# Patient Record
Sex: Female | Born: 1990
Health system: Southern US, Community
[De-identification: ages and names within clinical notes are randomized; demographics above are authoritative.]

## PROBLEM LIST (undated history)

## (undated) DIAGNOSIS — E119 Type 2 diabetes mellitus without complications: Secondary | ICD-10-CM

## (undated) DIAGNOSIS — F909 Attention-deficit hyperactivity disorder, unspecified type: Secondary | ICD-10-CM

## (undated) HISTORY — DX: Type 2 diabetes mellitus without complications: E11.9

## (undated) HISTORY — DX: Attention-deficit hyperactivity disorder, unspecified type: F90.9

## (undated) HISTORY — PX: TONSILLECTOMY: SUR1361

## (undated) HISTORY — PX: TYMPANOSTOMY TUBE PLACEMENT: SHX32

---
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
## 2008-04-07 ENCOUNTER — Emergency Department (HOSPITAL_COMMUNITY): Admission: EM | Admit: 2008-04-07 | Discharge: 2008-04-07 | Payer: Self-pay | Admitting: Emergency Medicine

## 2008-04-08 ENCOUNTER — Emergency Department (HOSPITAL_COMMUNITY): Admission: EM | Admit: 2008-04-08 | Discharge: 2008-04-08 | Payer: Self-pay | Admitting: Emergency Medicine

## 2008-07-09 ENCOUNTER — Encounter (HOSPITAL_COMMUNITY): Admission: RE | Admit: 2008-07-09 | Discharge: 2008-08-08 | Payer: Self-pay | Admitting: Orthopedic Surgery

## 2008-12-04 IMAGING — CT CT HEAD W/O CM
1 series · 16 of 30 positions shown, 20 images · non-contrast
Comparison: None

CLINICAL DATA: Frontal headache with light sensitivity.

CT HEAD WITHOUT CONTRAST
TECHNIQUE: Contiguous axial images were obtained from the base of
the skull through the vertex without contrast.

[Series 2: headseq 4.8 h37s · axial · 0.43mm/px · z∈[+69,+221]mm · 16 of 36 slices shown, 20 images]
[im 2/36  brain]
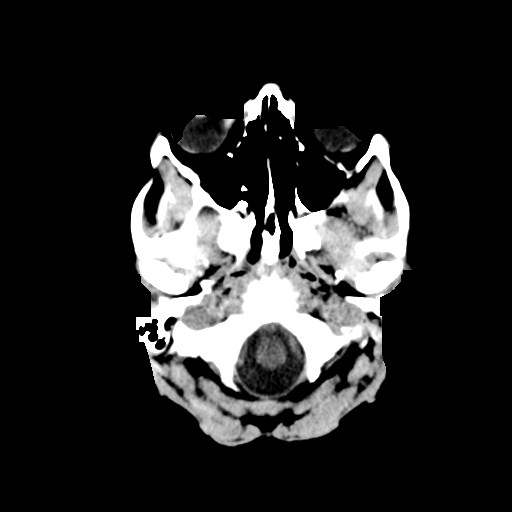
[im 2/36  bone]
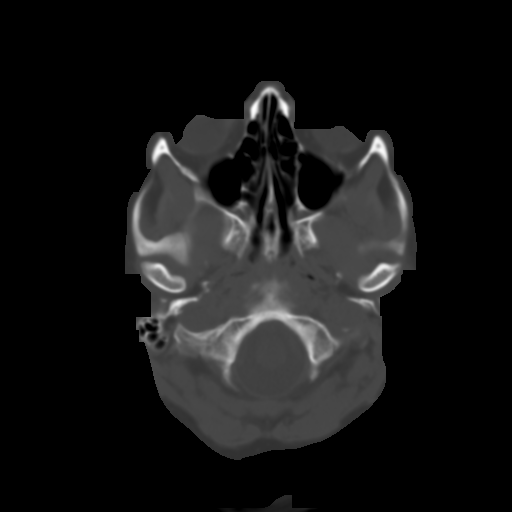
[im 4/36  brain]
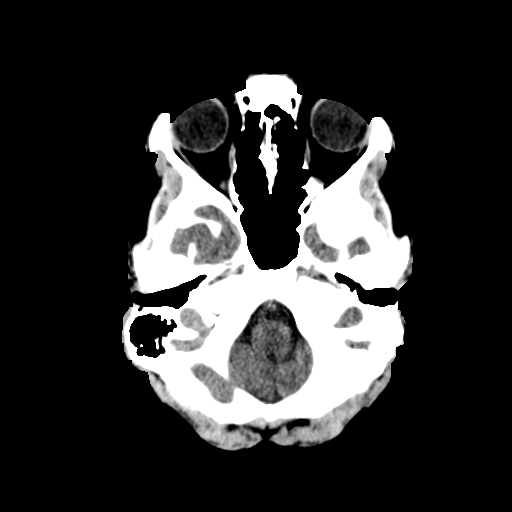
[im 7/36  brain]
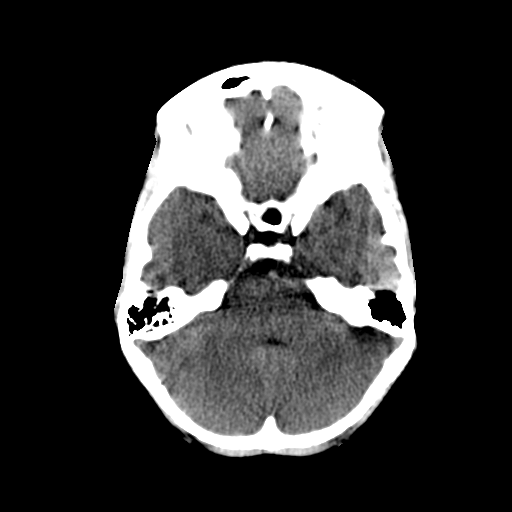
[im 9/36  brain]
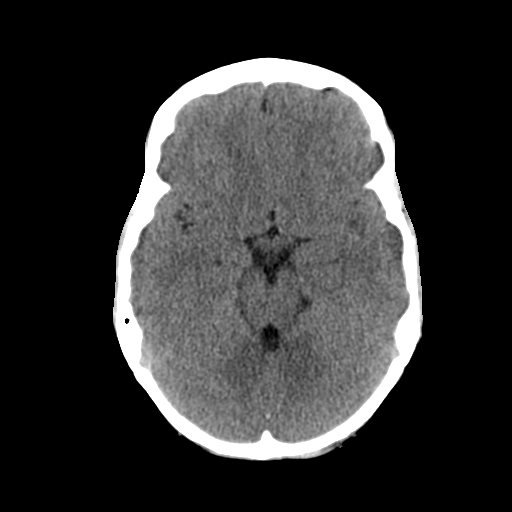
[im 10/36  brain]
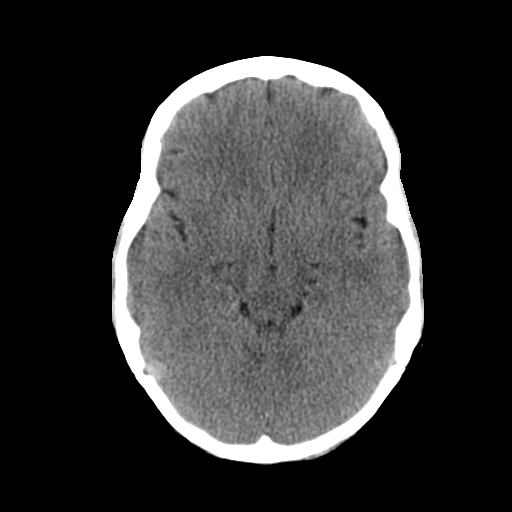
[im 10/36  bone]
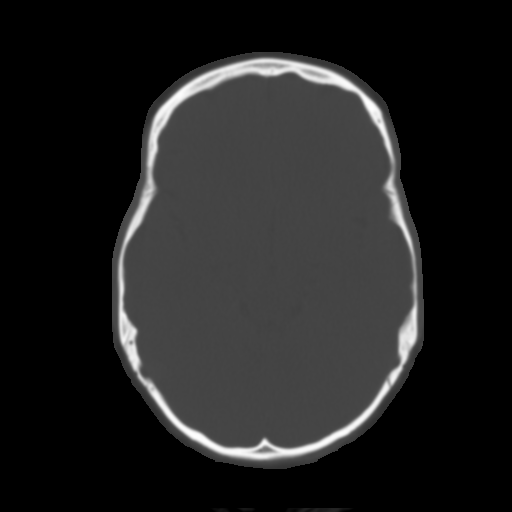
[im 13/36  brain]
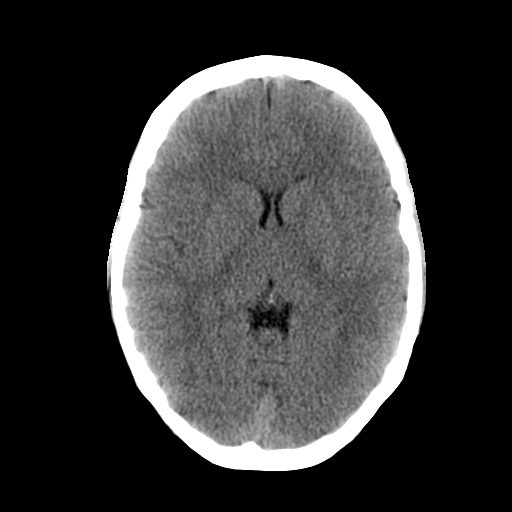
[im 15/36  brain]
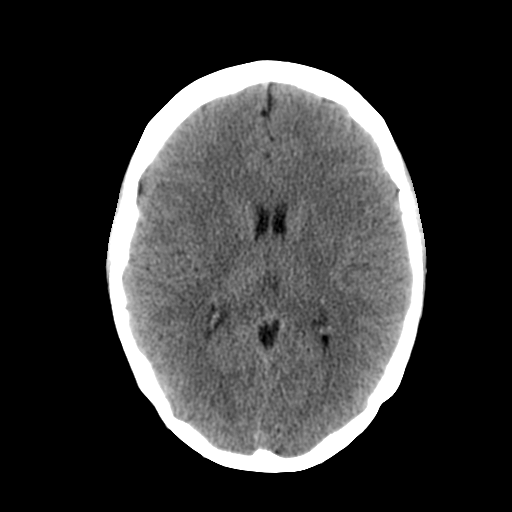
[im 17/36  brain]
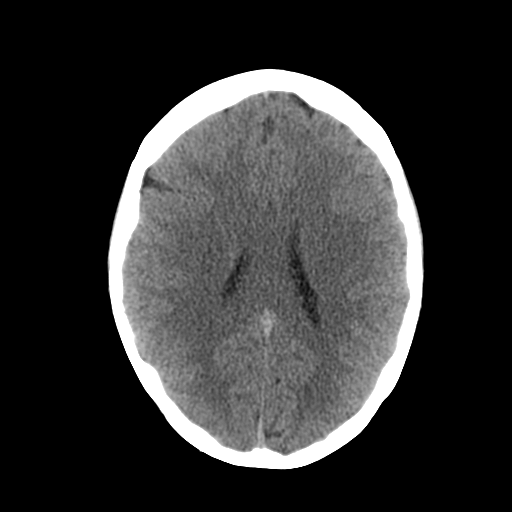
[im 19/36  brain]
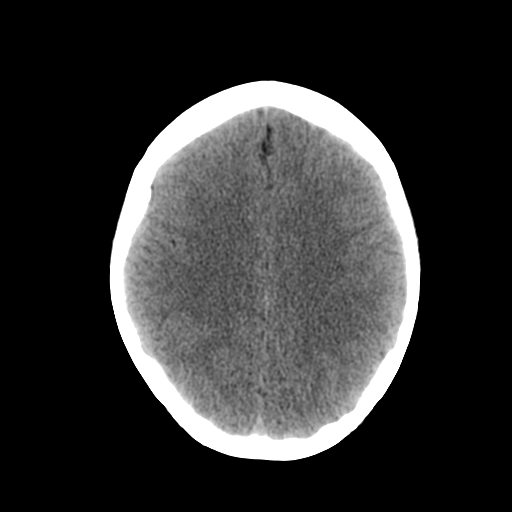
[im 19/36  bone]
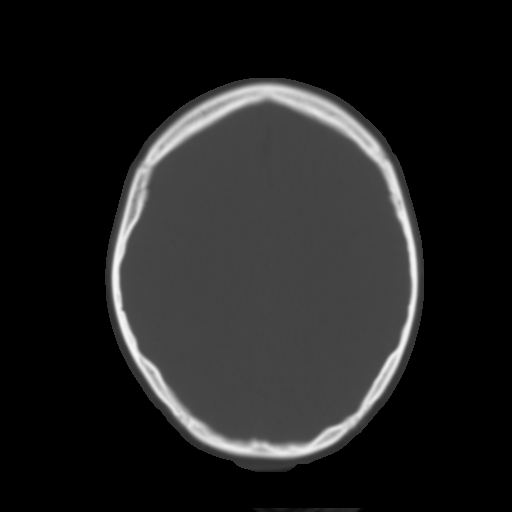
[im 21/36  brain]
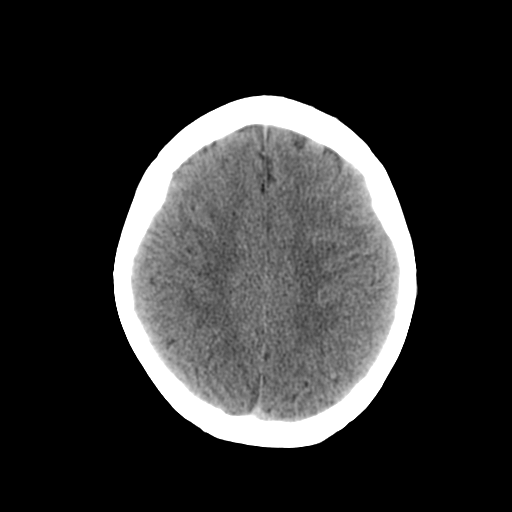
[im 23/36  brain]
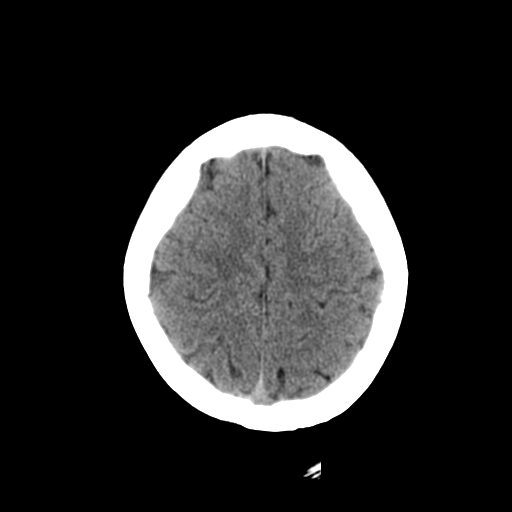
[im 26/36  brain]
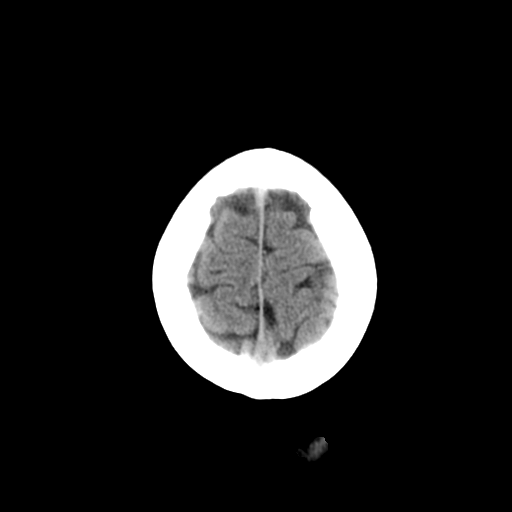
[im 27/36  brain]
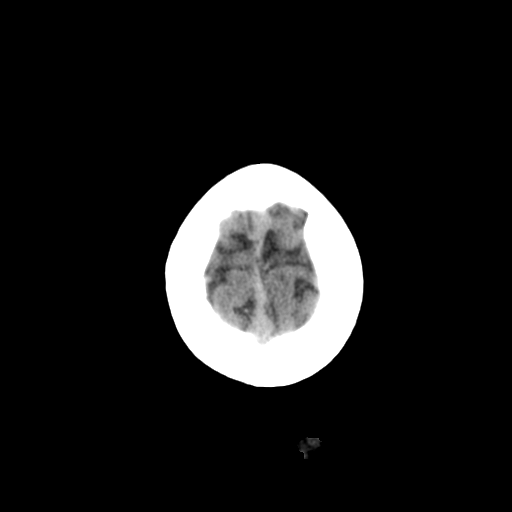
[im 27/36  bone]
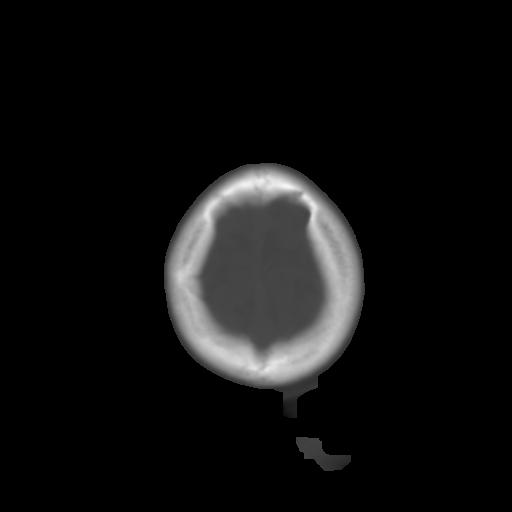
[im 29/36  brain]
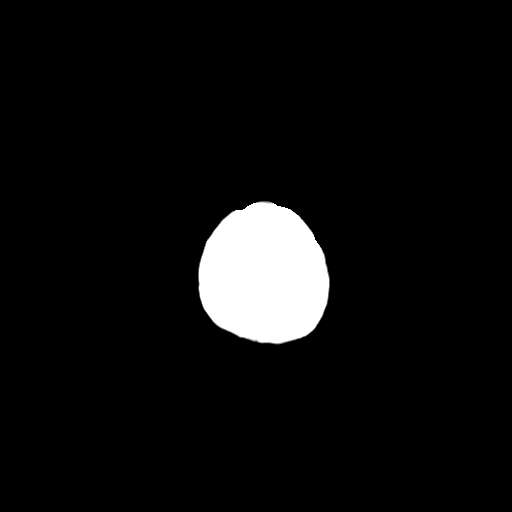
[im 32/36  brain]
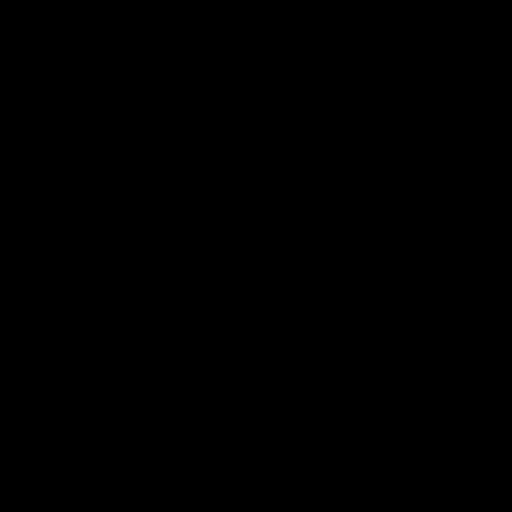
[im 34/36  brain]
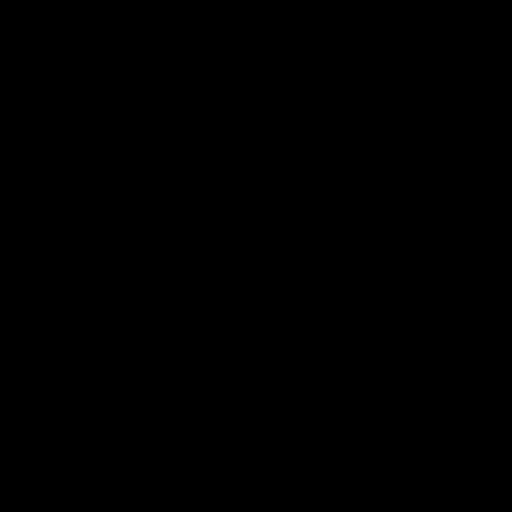

[16 of 30 positions shown; findings below may reference images not displayed]

FINDINGS: There is no evidence for acute hemorrhage, hydrocephalus,
mass lesion, or abnormal extra-axial fluid collection.  No definite
CT evidence for acute infarct.

Visualized portions of the paranasal sinuses are clear.  No
evidence for mastoid air cell effusion.  The the patient may have
had previous surgery in the left mastoid air cells.
IMPRESSION: No acute intracranial abnormality.

Apparent postsurgical change in the left mastoid air cells.

## 2010-07-14 LAB — URINE MICROSCOPIC-ADD ON

## 2010-07-14 LAB — CBC
Hemoglobin: 13.5 g/dL (ref 12.0–15.0)
Hemoglobin: 14.1 g/dL (ref 12.0–15.0)
MCHC: 32.8 g/dL (ref 30.0–36.0)
MCHC: 32.9 g/dL (ref 30.0–36.0)
Platelets: 259 10*3/uL (ref 150–400)
RBC: 5.15 MIL/uL — ABNORMAL HIGH (ref 3.87–5.11)
RDW: 13.4 % (ref 11.5–15.5)
WBC: 11.4 10*3/uL — ABNORMAL HIGH (ref 4.0–10.5)
WBC: 9.1 10*3/uL (ref 4.0–10.5)

## 2010-07-14 LAB — DIFFERENTIAL
Basophils Relative: 0 % (ref 0–1)
Basophils Relative: 0 % (ref 0–1)
Eosinophils Absolute: 0 10*3/uL (ref 0.0–0.7)
Eosinophils Relative: 0 % (ref 0–5)
Monocytes Absolute: 0.2 10*3/uL (ref 0.1–1.0)
Monocytes Relative: 6 % (ref 3–12)
Neutrophils Relative %: 88 % — ABNORMAL HIGH (ref 43–77)

## 2010-07-14 LAB — COMPREHENSIVE METABOLIC PANEL
AST: 21 U/L (ref 0–37)
BUN: 13 mg/dL (ref 6–23)
CO2: 26 mEq/L (ref 19–32)
Calcium: 9.2 mg/dL (ref 8.4–10.5)
Creatinine, Ser: 0.6 mg/dL (ref 0.4–1.2)
GFR calc non Af Amer: >60 mL/min (ref >60)
Glucose, Bld: 114 mg/dL — ABNORMAL HIGH (ref 70–99)
Potassium: 3.4 mEq/L — ABNORMAL LOW (ref 3.5–5.1)
Sodium: 138 mEq/L (ref 135–145)
Total Bilirubin: 0.6 mg/dL (ref 0.3–1.2)

## 2010-07-14 LAB — URINALYSIS, ROUTINE W REFLEX MICROSCOPIC
Protein, ur: NEGATIVE mg/dL
Urobilinogen, UA: 0.2 mg/dL (ref 0.0–1.0)

## 2010-07-14 LAB — LIPASE, BLOOD: Lipase: 35 U/L (ref 11–59)

## 2011-01-07 LAB — URINALYSIS, ROUTINE W REFLEX MICROSCOPIC
Hgb urine dipstick: NEGATIVE
Ketones, ur: NEGATIVE
Protein, ur: NEGATIVE
Specific Gravity, Urine: 1.02

## 2019-03-21 ENCOUNTER — Other Ambulatory Visit: Payer: Self-pay

## 2019-03-21 ENCOUNTER — Ambulatory Visit: Payer: HRSA Program | Attending: Internal Medicine

## 2019-03-21 DIAGNOSIS — Z20822 Contact with and (suspected) exposure to covid-19: Secondary | ICD-10-CM

## 2019-03-21 DIAGNOSIS — Z20828 Contact with and (suspected) exposure to other viral communicable diseases: Secondary | ICD-10-CM | POA: Diagnosis not present

## 2019-03-23 LAB — NOVEL CORONAVIRUS, NAA: SARS-CoV-2, NAA: NOT DETECTED

## 2019-05-22 ENCOUNTER — Other Ambulatory Visit: Payer: Self-pay

## 2019-05-22 ENCOUNTER — Ambulatory Visit: Payer: BC Managed Care – PPO | Attending: Internal Medicine

## 2019-05-22 DIAGNOSIS — Z20822 Contact with and (suspected) exposure to covid-19: Secondary | ICD-10-CM | POA: Insufficient documentation

## 2019-05-23 LAB — NOVEL CORONAVIRUS, NAA: SARS-CoV-2, NAA: NOT DETECTED

## 2020-02-01 ENCOUNTER — Other Ambulatory Visit: Payer: Self-pay | Admitting: Family Medicine

## 2020-02-01 DIAGNOSIS — G44209 Tension-type headache, unspecified, not intractable: Secondary | ICD-10-CM

## 2020-07-24 ENCOUNTER — Encounter (HOSPITAL_COMMUNITY): Payer: Self-pay

## 2020-07-24 ENCOUNTER — Other Ambulatory Visit: Payer: Self-pay

## 2020-07-24 ENCOUNTER — Ambulatory Visit (HOSPITAL_COMMUNITY): Payer: 59 | Attending: Family Medicine

## 2020-07-24 DIAGNOSIS — M6281 Muscle weakness (generalized): Secondary | ICD-10-CM

## 2020-07-24 DIAGNOSIS — M545 Low back pain, unspecified: Secondary | ICD-10-CM | POA: Diagnosis not present

## 2020-07-24 DIAGNOSIS — R262 Difficulty in walking, not elsewhere classified: Secondary | ICD-10-CM | POA: Diagnosis present

## 2020-07-24 NOTE — Therapy (Signed)
[facility] [address] [place], [place], [address] Phone: [phone]   Fax:  [phone]  Physical Therapy Evaluation  Patient Details  Name: Anita Jacobson MRN: [id number] Date of Birth: 12-19-1990 Referring Provider (PT): Donzetta Sprung, MD   Encounter Date: 07/24/2020   PT End of Session - 07/24/20 0947    Visit Number 1    Number of Visits 8    Date for PT Re-Evaluation 08/21/20    Authorization Type [employer]; x 10 auth req, 30 VL (0 used)    Authorization - Visit Number 1    Authorization - Number of Visits 30    Progress Note Due on Visit 8    PT Start Time [phone]    PT Stop Time 1030    PT Time Calculation (min) 44 min    Activity Tolerance Patient tolerated treatment well    Behavior During Therapy [facility] for tasks assessed/performed           History reviewed. No pertinent past medical history.  History reviewed. No pertinent surgical history.  There were no vitals filed for this visit.    Subjective Assessment - 07/24/20 0950    Subjective End of January was picking up object from the ground and felt onset of back pain and now has sharp pain radiating from low back along buttocks and lateral right thigh    Limitations Sitting;Lifting;Standing    How long can you sit comfortably? 5-10 min    How long can you walk comfortably? 10 min    Diagnostic tests x-ray    Patient Stated Goals "Get my back and leg feeling better"    Currently in Pain? Yes    Pain Score 7     Pain Location Back    Pain Orientation Right    Pain Descriptors / Indicators Aching;Guarding    Pain Type Chronic pain    Pain Radiating Towards right buttocks, lateral thigh, knee    Pain Onset More than a month ago    Pain Frequency Intermittent    Aggravating Factors  prolonged sitting, standing, bending, lifting, twisting    Effect of Pain on Daily Activities pt has had to drop to part-time work hours due to current condition               [facility] PT Assessment - 07/24/20 0001      Assessment   Medical Diagnosis LBP    Referring Provider (PT) Donzetta Sprung, MD    Onset Date/Surgical Date --   End of January   Next MD Visit some time in May      Balance Screen   Has the patient fallen in the past 6 months No    Has the patient had a decrease in activity level because of a fear of falling?  No    Is the patient reluctant to leave their home because of a fear of falling?  No      Home Environment   Living Environment Private residence    Living Arrangements Spouse/significant other;Children    Available Help at Discharge Family    Type of Home House    Home Access Stairs to enter    Home Layout One level      Prior Function   Level of Independence Independent    Vocation Full time employment    Vocation Requirements works at [employer] and [employer]      Observation/Other Assessments   Observations poor lifting mechanics  observed    Focus on Therapeutic Outcomes (FOTO)  45% function      ROM / Strength   AROM / PROM / Strength AROM;Strength      AROM   AROM Assessment Site Lumbar    Lumbar Flexion 50% limited    Lumbar Extension 25% limited, painful    Lumbar - Right Side Bend WNL    Lumbar - Left Side Bend WNL    Lumbar - Right Rotation WNL    Lumbar - Left Rotation WNL      Strength   Strength Assessment Site Lumbar    Lumbar Flexion 2+/5    Lumbar Extension 2+/5      Palpation   Spinal mobility guarding noted against PA pressure      Special Tests    Special Tests Lumbar    Lumbar Tests Slump Test;Prone Knee Bend Test;Straight Leg Raise      Slump test   Findings Positive    Side Right      Prone Knee Bend Test   Findings Negative    Side Right      Straight Leg Raise   Findings Positive    Side  Right   15 degrees   Comment Lt   20 degrees     Ambulation/Gait   Ambulation/Gait Yes    Ambulation/Gait Assistance 7: Independent    Ambulation Distance (Feet) 250 Feet     Assistive device None    Gait Pattern Antalgic   RLE   Gait velocity decreased    Gait Comments                      Objective measurements completed on examination: See above findings.       [facility] Adult PT Treatment/Exercise - 07/24/20 0001      Exercises   Exercises Lumbar      Lumbar Exercises: Stretches   Standing Extension 10 reps;5 seconds      Lumbar Exercises: Seated   Other Seated Lumbar Exercises seated lumbar extensions with McKenzie roll      Lumbar Exercises: Prone   Other Prone Lumbar Exercises prone lying x 5 min, prone on elbows 2x10, prone press-ups 1x10                  PT Education - 07/24/20 1035    Education Details education on assessment findings, postural re-education, lumbar support    Person(s) Educated Patient    Methods Explanation;Demonstration;Handout    Comprehension Verbalized understanding;Need further instruction            PT Short Term Goals - 07/24/20 1039      PT SHORT TERM GOAL #1   Title Patient will report at least 25% improvement in symptoms for improved quality of life.    Time 2    Period Weeks    Status New    Target Date 08/07/20      PT SHORT TERM GOAL #2   Title Patient will be independent with HEP in order to improve functional outcomes.    Time 2    Period Weeks    Status New    Target Date 08/07/20      PT SHORT TERM GOAL #3   Title Pt will report RLE symptoms not extending beyond right buttocks to manifest centralization of symptoms    Baseline lateral right knee    Time 2    Period Weeks    Status New  Target Date 08/07/20             PT Long Term Goals - 07/24/20 1041      PT LONG TERM GOAL #1   Title Patient will improve on FOTO score to meet predicted outcomes to improve functional independence    Baseline 45% function    Time 4    Period Weeks    Status New    Target Date 08/21/20      PT LONG TERM GOAL #2   Title Patient will demo core strength of 3+/5 to  improve trunk stability and lifting mechanics to reduce risk for re-injury    Baseline 2+/5 flexion/extension    Time 4    Period Weeks    Status New    Target Date 08/21/20      PT LONG TERM GOAL #3   Title Pt will report 0/10 pain and improved ambulation tolerance as evidenced by 400 ft during    Baseline 250 ft with 7/10 back and RLE pain    Time 4    Period Weeks    Status New    Target Date 08/21/20                  Plan - 07/24/20 1036    Clinical Impression Statement Patient is a  30 yo lady presenting to physical therapy with c/o right side LBP and radiating RLE pain. She presents with pain limited deficits in trunk strength, ROM, endurance, postural impairments, spinal mobility and functional mobility with ADL. She is having to modify and restrict ADL as indicated by FOTO score as well as subjective information and objective measures which is affecting overall participation. Patient will benefit from skilled physical therapy in order to improve function and reduce impairment.    Personal Factors and Comorbidities Time since onset of injury/illness/exacerbation    Examination-Activity Limitations Bend;Caring for Others;Carry;Lift;Stand;Squat;Sit;Locomotion Level    Examination-Participation Restrictions Cleaning;Community Activity;Driving;Laundry;Yard Work;Occupation    Stability/Clinical Decision Making Stable/Uncomplicated    Clinical Decision Making Low    Rehab Potential Excellent    PT Frequency 2x / week    PT Duration 4 weeks    PT Treatment/Interventions ADLs/Self Care Home Management;Aquatic Therapy;Electrical Stimulation;Ultrasound;Traction;Neuromuscular re-education;Gait training;Stair training;Functional mobility training;Therapeutic activities;Therapeutic exercise;Balance training;Patient/family education;Manual techniques;Passive range of motion;Taping;Energy conservation;Dry needling;Spinal Manipulations;Joint Manipulations    PT Next Visit Plan assess  for extension-bias response, progress extension based program if applicable, neutral core strengthening    PT Home Exercise Plan prone position to press-ups, sitting with lumbar roll, standing extensions    Consulted and Agree with Plan of Care Patient           Patient will benefit from skilled therapeutic intervention in order to improve the following deficits and impairments:  Abnormal gait,Decreased activity tolerance,Decreased mobility,Decreased endurance,Decreased range of motion,Decreased strength,Difficulty walking,Increased muscle spasms,Impaired perceived functional ability,Postural dysfunction,Pain,Obesity,Improper body mechanics  Visit Diagnosis: Right low back pain, unspecified chronicity, unspecified whether sciatica present  Difficulty in walking, not elsewhere classified  Muscle weakness (generalized)     Problem List There are no problems to display for this patient.  10:45 AM, 07/24/20 M. Shary Decamp, PT, DPT Physical Therapist- [place] Office Number: [phone]  [facility] [address] [place], [place], [address] Phone: [phone]   Fax:  [phone]  Name: Anita Jacobson MRN: [id number] Date of Birth: 12-Mar-1991

## 2020-07-24 NOTE — Patient Instructions (Signed)
Access Code: [email or web address] URL: [email or web address] Date: 07/24/2020 Prepared by: Shary Decamp  Exercises Prone Press Up On Elbows - 1 x daily - 7 x weekly - 3 sets - 10 reps Prone Press Up - 1 x daily - 7 x weekly - 3 sets - 10 reps Seated Thoracic Lumbar Extension with Pectoralis Stretch - 1 x daily - 7 x weekly - 3 sets - 10 reps Standing Lumbar Extension - 1 x daily - 7 x weekly - 3 sets - 10 reps

## 2020-07-31 ENCOUNTER — Ambulatory Visit (HOSPITAL_COMMUNITY): Payer: 59

## 2020-07-31 ENCOUNTER — Telehealth (HOSPITAL_COMMUNITY): Payer: Self-pay

## 2020-07-31 NOTE — Telephone Encounter (Signed)
Patient's partner has covid and pt cx for today- Anita Jacobson will return on 08/07/20

## 2020-08-02 ENCOUNTER — Ambulatory Visit (HOSPITAL_COMMUNITY): Payer: 59

## 2020-08-07 ENCOUNTER — Encounter (HOSPITAL_COMMUNITY): Payer: Self-pay

## 2020-08-07 ENCOUNTER — Ambulatory Visit (HOSPITAL_COMMUNITY): Payer: 59 | Attending: Family Medicine

## 2020-08-07 ENCOUNTER — Other Ambulatory Visit: Payer: Self-pay

## 2020-08-07 DIAGNOSIS — M6281 Muscle weakness (generalized): Secondary | ICD-10-CM | POA: Diagnosis present

## 2020-08-07 DIAGNOSIS — R262 Difficulty in walking, not elsewhere classified: Secondary | ICD-10-CM | POA: Insufficient documentation

## 2020-08-07 DIAGNOSIS — M545 Low back pain, unspecified: Secondary | ICD-10-CM | POA: Diagnosis not present

## 2020-08-07 NOTE — Patient Instructions (Signed)
Access Code: [email or web address] URL: [email or web address] Date: 08/07/2020 Prepared by: Shary Decamp  Exercises Prone Alternating Arm and Leg Lifts - 1 x daily - 7 x weekly - 3 sets - 10 reps Prone Quadriceps Stretch with Strap - 1 x daily - 7 x weekly - 3 sets - 3 reps - 30-60 sec hold Abdominal Bracing - 1 x daily - 7 x weekly - 3 sets - 10 reps Push-Up on Counter - 1 x daily - 7 x weekly - 3 sets - 10 reps

## 2020-08-07 NOTE — Therapy (Signed)
[facility] [address] [place], [place], [address] Phone: [phone]   Fax:  [phone]  Physical Therapy Treatment  Patient Details  Name: Anita Jacobson MRN: [id number] Date of Birth: 10/14/1990 Referring Provider (PT): Donzetta Sprung, MD   Encounter Date: 08/07/2020   PT End of Session - 08/07/20 0954    Visit Number 2    Number of Visits 8    Date for PT Re-Evaluation 08/21/20    Authorization Type [employer]; x 10 auth req, 30 VL (0 used)    Authorization - Visit Number 2    Authorization - Number of Visits 30    Progress Note Due on Visit 8    PT Start Time [id number]    PT Stop Time 1030    PT Time Calculation (min) 43 min    Activity Tolerance Patient tolerated treatment well    Behavior During Therapy [facility] for tasks assessed/performed           History reviewed. No pertinent past medical history.  History reviewed. No pertinent surgical history.  There were no vitals filed for this visit.   Subjective Assessment - 08/07/20 0952    Subjective reports improved symptoms with centralization of RLE symptoms with majority of discomfot now isolated to right buttocks    Limitations Sitting;Lifting;Standing    How long can you sit comfortably? 5-10 min    How long can you walk comfortably? 10 min    Diagnostic tests x-ray    Patient Stated Goals "Get my back and leg feeling better"    Currently in Pain? Yes    Pain Score 5     Pain Location Buttocks    Pain Orientation Right    Pain Descriptors / Indicators Aching    Pain Type Chronic pain    Pain Radiating Towards right buttocks    Pain Onset More than a month ago                             [facility] Adult PT Treatment/Exercise - 08/07/20 0001      Lumbar Exercises: Standing   Other Standing Lumbar Exercises counter top push-ups 2x10      Lumbar Exercises: Supine   Ab Set 20 reps;2 seconds      Lumbar Exercises: Prone   Opposite Arm/Leg Raise  Right arm/Left leg;Left arm/Right leg;10 reps    Other Prone Lumbar Exercises prone x 3 min, prone on elbows 10x, prone extensions with therapist overpressure                  PT Education - 08/07/20 1008    Education Details education on HEP updates and core strengthening    Person(s) Educated Patient    Methods Explanation;Demonstration    Comprehension Verbalized understanding            PT Short Term Goals - 07/24/20 1039      PT SHORT TERM GOAL #1   Title Patient will report at least 25% improvement in symptoms for improved quality of life.    Time 2    Period Weeks    Status New    Target Date 08/07/20      PT SHORT TERM GOAL #2   Title Patient will be independent with HEP in order to improve functional outcomes.    Time 2    Period Weeks    Status New    Target Date 08/07/20  PT SHORT TERM GOAL #3   Title Pt will report RLE symptoms not extending beyond right buttocks to manifest centralization of symptoms    Baseline lateral right knee    Time 2    Period Weeks    Status New    Target Date 08/07/20             PT Long Term Goals - 07/24/20 1041      PT LONG TERM GOAL #1   Title Patient will improve on FOTO score to meet predicted outcomes to improve functional independence    Baseline 45% function    Time 4    Period Weeks    Status New    Target Date 08/21/20      PT LONG TERM GOAL #2   Title Patient will demo core strength of 3+/5 to improve trunk stability and lifting mechanics to reduce risk for re-injury    Baseline 2+/5 flexion/extension    Time 4    Period Weeks    Status New    Target Date 08/21/20      PT LONG TERM GOAL #3   Title Pt will report 0/10 pain and improved ambulation tolerance as evidenced by 400 ft during    Baseline 250 ft with 7/10 back and RLE pain    Time 4    Period Weeks    Status New    Target Date 08/21/20                 Plan - 08/07/20 1016    Clinical Impression Statement Pt  presents today with improved symptoms centralized to right buttocks and even greater centralization following manual therapy and stabilization exercises. Good reaction to extension-bias and improved ability to perfom abdominal sets with tactile cues and training for lumbar neutral.  Pt demo quite of a bit of global hypermobility and would benefit from continued PT sessions to focus on core strength, coordination, and proper lifting techniuqes to prevent re-injury    Personal Factors and Comorbidities Time since onset of injury/illness/exacerbation    Examination-Activity Limitations Bend;Caring for Others;Carry;Lift;Stand;Squat;Sit;Locomotion Level    Examination-Participation Restrictions Cleaning;Community Activity;Driving;Laundry;Yard Work;Occupation    Stability/Clinical Decision Making Stable/Uncomplicated    Rehab Potential Excellent    PT Frequency 2x / week    PT Duration 4 weeks    PT Treatment/Interventions ADLs/Self Care Home Management;Aquatic Therapy;Electrical Stimulation;Ultrasound;Traction;Neuromuscular re-education;Gait training;Stair training;Functional mobility training;Therapeutic activities;Therapeutic exercise;Balance training;Patient/family education;Manual techniques;Passive range of motion;Taping;Energy conservation;Dry needling;Spinal Manipulations;Joint Manipulations    PT Next Visit Plan good extension-bias, progress core strength and functional lifts with ab set    PT Home Exercise Plan prone position to press-ups, sitting with lumbar roll, standing extensions, swimmers, prone quad stretch, ab set, countertop pushups    Consulted and Agree with Plan of Care Patient           Patient will benefit from skilled therapeutic intervention in order to improve the following deficits and impairments:  Abnormal gait,Decreased activity tolerance,Decreased mobility,Decreased endurance,Decreased range of motion,Decreased strength,Difficulty walking,Increased muscle spasms,Impaired  perceived functional ability,Postural dysfunction,Pain,Obesity,Improper body mechanics  Visit Diagnosis: Right low back pain, unspecified chronicity, unspecified whether sciatica present  Difficulty in walking, not elsewhere classified  Muscle weakness (generalized)     Problem List There are no problems to display for this patient.  10:26 AM, 08/07/20 M. Shary Decamp, PT, DPT Physical Therapist- [place] Office Number: [phone]  [facility] [address] [place], [place], [address] Phone: [phone]  Fax:  [phone]  Name: Anita Jacobson MRN: [id number] Date of Birth: [birth year]

## 2020-08-09 ENCOUNTER — Ambulatory Visit (HOSPITAL_COMMUNITY): Payer: 59

## 2020-08-14 ENCOUNTER — Telehealth (HOSPITAL_COMMUNITY): Payer: Self-pay | Admitting: Physical Therapy

## 2020-08-14 ENCOUNTER — Ambulatory Visit (HOSPITAL_COMMUNITY): Payer: 59 | Admitting: Physical Therapy

## 2020-08-14 NOTE — Telephone Encounter (Signed)
No show. Called and patient reported she called and canceled today's apt yesterday. Confirmed Friday's appointment.   11:18 AM, 08/14/20 Tereasa Coop, DPT Physical Therapy with [facility]  [phone] office

## 2020-08-16 ENCOUNTER — Encounter (HOSPITAL_COMMUNITY): Payer: Self-pay

## 2020-08-16 ENCOUNTER — Ambulatory Visit (HOSPITAL_COMMUNITY): Payer: 59

## 2020-08-16 NOTE — Therapy (Signed)
[place], [place], [address] Phone: [phone]   Fax:  [phone]  Patient Details  Name: Anita Jacobson MRN: [id number] Date of Birth: 03/17/1991 Referring Provider:  No ref. provider found  Encounter Date: 08/16/2020  PHYSICAL THERAPY DISCHARGE SUMMARY  Visits from Start of Care: 2  Current functional level related to goals / functional outcomes: Improving symptoms reported at last encounter   Remaining deficits: Unable to determine. Pt cancelled remaining visits due to working out of town   Education / Equipment: Preliminary HEP Plan: Patient agrees to discharge.  Patient goals were partially met. Patient is being discharged due to the patient's request.  ?????      7:45 AM, 08/16/20 M. Sherlyn Lees, PT, DPT Physical Therapist- [place] Office Number: [phone]  [place] [address] [place], [place], [address] Phone: [phone]   Fax:  [phone]

## 2020-08-21 ENCOUNTER — Encounter (HOSPITAL_COMMUNITY): Payer: 59

## 2020-08-23 ENCOUNTER — Encounter (HOSPITAL_COMMUNITY): Payer: 59

## 2021-11-26 DIAGNOSIS — Z6837 Body mass index (BMI) 37.0-37.9, adult: Secondary | ICD-10-CM | POA: Diagnosis not present

## 2021-11-26 DIAGNOSIS — R030 Elevated blood-pressure reading, without diagnosis of hypertension: Secondary | ICD-10-CM | POA: Diagnosis not present

## 2021-11-26 DIAGNOSIS — R059 Cough, unspecified: Secondary | ICD-10-CM | POA: Diagnosis not present

## 2022-03-24 ENCOUNTER — Other Ambulatory Visit: Payer: Self-pay

## 2022-03-24 ENCOUNTER — Emergency Department (HOSPITAL_COMMUNITY)
Admission: EM | Admit: 2022-03-24 | Discharge: 2022-03-24 | Disposition: A | Discharge status: Home / Self Care | Payer: 59 | Attending: Emergency Medicine | Admitting: Emergency Medicine

## 2022-03-24 ENCOUNTER — Emergency Department (HOSPITAL_COMMUNITY): Payer: 59

## 2022-03-24 ENCOUNTER — Encounter (HOSPITAL_COMMUNITY): Payer: Self-pay | Admitting: *Deleted

## 2022-03-24 DIAGNOSIS — M542 Cervicalgia: Secondary | ICD-10-CM | POA: Diagnosis not present

## 2022-03-24 DIAGNOSIS — M25511 Pain in right shoulder: Secondary | ICD-10-CM | POA: Insufficient documentation

## 2022-03-24 DIAGNOSIS — Y92410 Unspecified street and highway as the place of occurrence of the external cause: Secondary | ICD-10-CM | POA: Diagnosis not present

## 2022-03-24 MED ORDER — IBUPROFEN 800 MG PO TABS
800.0000 mg | ORAL_TABLET | Freq: Once | ORAL | Status: AC
  Administered 2022-03-24: 800 mg via ORAL
  Filled 2022-03-24: qty 1

## 2022-03-24 MED ORDER — METHOCARBAMOL 500 MG PO TABS: 500.0000 mg | ORAL_TABLET | Freq: Two times a day (BID) | ORAL | 0 refills | Status: DC

## 2022-03-24 MED ORDER — METHOCARBAMOL 500 MG PO TABS
1000.0000 mg | ORAL_TABLET | Freq: Once | ORAL | Status: AC
  Administered 2022-03-24: 1000 mg via ORAL
  Filled 2022-03-24: qty 2
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
## 2022-03-24 NOTE — Discharge Instructions (Signed)
Thank you for allowing me to be part of your care today.  Your x-ray was reassuring for no broken bones or dislocation.  I recommend taking 600-800 mg of ibuprofen every 6-8 hours as needed for musculoskeletal pain.  I have sent over a prescription for Robaxin to the pharmacy to use for muscle spasms.  Do not take this medicine and drive as it may make you drowsy or dizzy.  You can get over the counter lidocaine patches as well to help with pain in your neck or shoulder.   I recommend following up with your primary care provider if your symptoms do not begin to improve with time and treatment.

## 2023-02-28 DIAGNOSIS — Z419 Encounter for procedure for purposes other than remedying health state, unspecified: Secondary | ICD-10-CM | POA: Diagnosis not present

## 2023-03-13 ENCOUNTER — Emergency Department (HOSPITAL_COMMUNITY): Payer: 59

## 2023-03-13 ENCOUNTER — Encounter (HOSPITAL_COMMUNITY): Payer: Self-pay

## 2023-03-13 ENCOUNTER — Emergency Department (HOSPITAL_COMMUNITY)
Admission: EM | Admit: 2023-03-13 | Discharge: 2023-03-13 | Disposition: A | Discharge status: Home / Self Care | Payer: 59 | Attending: Student | Admitting: Student

## 2023-03-13 ENCOUNTER — Other Ambulatory Visit: Payer: Self-pay

## 2023-03-13 DIAGNOSIS — N200 Calculus of kidney: Secondary | ICD-10-CM

## 2023-03-13 DIAGNOSIS — E876 Hypokalemia: Secondary | ICD-10-CM | POA: Diagnosis not present

## 2023-03-13 DIAGNOSIS — N201 Calculus of ureter: Secondary | ICD-10-CM | POA: Insufficient documentation

## 2023-03-13 DIAGNOSIS — N202 Calculus of kidney with calculus of ureter: Secondary | ICD-10-CM | POA: Diagnosis not present

## 2023-03-13 DIAGNOSIS — R109 Unspecified abdominal pain: Secondary | ICD-10-CM | POA: Diagnosis not present

## 2023-03-13 LAB — URINALYSIS, ROUTINE W REFLEX MICROSCOPIC
Bilirubin Urine: NEGATIVE
Glucose, UA: NEGATIVE mg/dL
Ketones, ur: NEGATIVE mg/dL
Nitrite: NEGATIVE
Protein, ur: 100 mg/dL — AB
RBC / HPF: >50 RBC/hpf (ref 0–5)
Specific Gravity, Urine: 1.019 (ref 1.005–1.030)
pH: 7 (ref 5.0–8.0)

## 2023-03-13 LAB — COMPREHENSIVE METABOLIC PANEL
ALT: 17 U/L (ref 0–44)
AST: 18 U/L (ref 15–41)
Albumin: 3.8 g/dL (ref 3.5–5.0)
Alkaline Phosphatase: 49 U/L (ref 38–126)
Anion gap: 9 (ref 5–15)
BUN: 8 mg/dL (ref 6–20)
CO2: 23 mmol/L (ref 22–32)
Calcium: 8.9 mg/dL (ref 8.9–10.3)
Chloride: 106 mmol/L (ref 98–111)
Creatinine, Ser: 0.71 mg/dL (ref 0.44–1.00)
GFR, Estimated: >60 mL/min (ref >60)
Glucose, Bld: 115 mg/dL — ABNORMAL HIGH (ref 70–99)
Potassium: 3.3 mmol/L — ABNORMAL LOW (ref 3.5–5.1)
Sodium: 138 mmol/L (ref 135–145)
Total Bilirubin: 0.4 mg/dL (ref <1.2)
Total Protein: 6.8 g/dL (ref 6.5–8.1)

## 2023-03-13 LAB — CBC
HCT: 38.4 % (ref 36.0–46.0)
Hemoglobin: 12.1 g/dL (ref 12.0–15.0)
MCH: 24.9 pg — ABNORMAL LOW (ref 26.0–34.0)
MCHC: 31.5 g/dL (ref 30.0–36.0)
MCV: 79 fL — ABNORMAL LOW (ref 80.0–100.0)
Platelets: 317 10*3/uL (ref 150–400)
RBC: 4.86 MIL/uL (ref 3.87–5.11)
RDW: 15.4 % (ref 11.5–15.5)
WBC: 13.7 10*3/uL — ABNORMAL HIGH (ref 4.0–10.5)
nRBC: 0 % (ref 0.0–0.2)

## 2023-03-13 LAB — LIPASE, BLOOD: Lipase: 29 U/L (ref 11–51)

## 2023-03-13 MED ORDER — TAMSULOSIN HCL 0.4 MG PO CAPS
0.4000 mg | ORAL_CAPSULE | Freq: Once | ORAL | Status: AC
  Administered 2023-03-13: 0.4 mg via ORAL
  Filled 2023-03-13: qty 1

## 2023-03-13 MED ORDER — LACTATED RINGERS IV BOLUS
1000.0000 mL | Freq: Once | INTRAVENOUS | Status: AC
  Administered 2023-03-13: 1000 mL via INTRAVENOUS

## 2023-03-13 MED ORDER — ACETAMINOPHEN 500 MG PO TABS
1000.0000 mg | ORAL_TABLET | Freq: Three times a day (TID) | ORAL | 0 refills | Status: AC
Start: 2023-03-13 — End: 2023-04-12

## 2023-03-13 MED ORDER — KETOROLAC TROMETHAMINE 15 MG/ML IJ SOLN
15.0000 mg | Freq: Once | INTRAMUSCULAR | Status: AC
  Administered 2023-03-13: 15 mg via INTRAVENOUS
  Filled 2023-03-13: qty 1

## 2023-03-13 MED ORDER — FENTANYL CITRATE PF 50 MCG/ML IJ SOSY
50.0000 ug | PREFILLED_SYRINGE | Freq: Once | INTRAMUSCULAR | Status: AC
  Administered 2023-03-13: 50 ug via INTRAVENOUS
  Filled 2023-03-13: qty 1

## 2023-03-13 MED ORDER — ONDANSETRON HCL 4 MG/2ML IJ SOLN
4.0000 mg | Freq: Once | INTRAMUSCULAR | Status: AC
  Administered 2023-03-13: 4 mg via INTRAVENOUS
  Filled 2023-03-13: qty 2

## 2023-03-13 MED ORDER — OXYCODONE HCL 5 MG PO TABS: 5.0000 mg | ORAL_TABLET | Freq: Four times a day (QID) | ORAL | 0 refills | Status: DC | PRN

## 2023-03-13 MED ORDER — NAPROXEN 375 MG PO TABS: 375.0000 mg | ORAL_TABLET | Freq: Two times a day (BID) | ORAL | 0 refills | Status: DC

## 2023-03-13 MED ORDER — HYDROCODONE-ACETAMINOPHEN 5-325 MG PO TABS
1.0000 | ORAL_TABLET | Freq: Once | ORAL | Status: AC
  Administered 2023-03-13: 1 via ORAL
  Filled 2023-03-13: qty 1

## 2023-03-13 NOTE — ED Notes (Signed)
Patient transported to CT 

## 2023-03-13 NOTE — Discharge Instructions (Signed)
For pain:  - Acetaminophen 1000 mg three times daily (every 8 hours) - Naproxen 2 times daily (every 12 hours) - oxycodone for breakthrough pain only 

## 2023-03-13 NOTE — ED Triage Notes (Signed)
Pt reports left side flank pain and vomiting since 330 today.

## 2023-03-13 NOTE — ED Provider Notes (Signed)
EMERGENCY DEPARTMENT AT [facility] Provider Note  CSN: [id number] Arrival date & time: 03/13/23 1936  Chief Complaint(s) Emesis  HPI Anita Jacobson is a 32 y.o. female who presents emergency room for evaluation of flank pain and abdominal pain.  States that symptoms began abruptly at 330 and severely worsened approximately 20 minutes prior to arrival.  Endorses associated nausea and vomiting.  Denies dysuria, chest pain, shortness of breath, headache, fever or other systemic symptoms.  No previous history of this before.   Past Medical History History reviewed. No pertinent past medical history. There are no active problems to display for this patient.  Home Medication(s) Prior to Admission medications   Medication Sig Start Date End Date Taking? Authorizing Provider  methocarbamol (ROBAXIN) 500 MG tablet Take 1 tablet (500 mg total) by mouth 2 (two) times daily. 03/24/22   Lenard Simmer, PA-C                                                                                                                                    Past Surgical History Past Surgical History:  Procedure Laterality Date   TONSILLECTOMY     TYMPANOSTOMY TUBE PLACEMENT     Family History History reviewed. No pertinent family history.  Social History Social History   Tobacco Use   Smoking status: Never   Smokeless tobacco: Never  Vaping Use   Vaping status: Never Used  Substance Use Topics   Alcohol use: Not Currently   Drug use: Yes    Types: Marijuana   Allergies Patient has no known allergies.  Review of Systems Review of Systems  Gastrointestinal:  Positive for abdominal pain, nausea and vomiting.  Genitourinary:  Positive for flank pain.    Physical Exam Vital Signs  I have reviewed the triage vital signs BP 127/78 (BP Location: Right Arm)   Pulse 73   Temp 99.2 F (37.3 C) (Oral)   Resp (!) 22   Wt 113.4 kg   LMP 02/13/2023   SpO2 97%   BMI 40.35 kg/m    Physical Exam Vitals and nursing note reviewed.  Constitutional:      General: She is not in acute distress.    Appearance: She is well-developed.  HENT:     Head: Normocephalic and atraumatic.  Eyes:     Conjunctiva/sclera: Conjunctivae normal.  Cardiovascular:     Rate and Rhythm: Normal rate and regular rhythm.     Heart sounds: No murmur heard. Pulmonary:     Effort: Pulmonary effort is normal. No respiratory distress.     Breath sounds: Normal breath sounds.  Abdominal:     Palpations: Abdomen is soft.     Tenderness: There is no abdominal tenderness. There is left CVA tenderness.  Musculoskeletal:        General: No swelling.     Cervical back: Neck supple.  Skin:    General: Skin  is warm and dry.     Capillary Refill: Capillary refill takes less than 2 seconds.  Neurological:     Mental Status: She is alert.  Psychiatric:        Mood and Affect: Mood normal.     ED Results and Treatments Labs (all labs ordered are listed, but only abnormal results are displayed) Labs Reviewed  LIPASE, BLOOD  COMPREHENSIVE METABOLIC PANEL  CBC  URINALYSIS, ROUTINE W REFLEX MICROSCOPIC  POC URINE PREG, ED                                                                                                                          Radiology No results found.  Pertinent labs & imaging results that were available during my care of the patient were reviewed by me and considered in my medical decision making (see MDM for details).  Medications Ordered in ED Medications  ketorolac (TORADOL) 15 MG/ML injection 15 mg (has no administration in time range)  ondansetron (ZOFRAN) injection 4 mg (has no administration in time range)  lactated ringers bolus 1,000 mL (has no administration in time range)                                                                                                                                     Procedures Procedures  (including critical care  time)  Medical Decision Making / ED Course   This patient presents to the ED for concern of flank pain, this involves an extensive number of treatment options, and is a complaint that carries with it a high risk of complications and morbidity.  The differential diagnosis includes nephrolithiasis, pyelonephritis, obstruction, AAA, musculoskeletal strain, vertebral fracture, intra-abdominal abscess, diverticulitis  MDM: Patient seen emergency room for evaluation of flank pain.  Physical exam reveals a very uncomfortable appearing patient with dizziness with what unremarkable.  Laboratory evaluation with some mild hypokalemia to 3.3 but creatinine is normal at 0.71, leukocytosis of 13.7.  CT stone study showing a 5 mm stone in the proximal left ureter with no significant hydronephrosis.  Patient pain controlled and fluid resuscitated with improvement of symptoms.  Outpatient urology follow-up sent.  At time of signout, patient pending urinalysis.  Please see provider signout for continuation of workup.  Anticipate discharge.   Additional history obtained: -Additional history obtained from partner -External records from outside source obtained and reviewed including: Chart review  including previous notes, labs, imaging, consultation notes   Lab Tests: -I ordered, reviewed, and interpreted labs.   The pertinent results include:   Labs Reviewed  LIPASE, BLOOD  COMPREHENSIVE METABOLIC PANEL  CBC  URINALYSIS, ROUTINE W REFLEX MICROSCOPIC  POC URINE PREG, ED      Imaging Studies ordered: I ordered imaging studies including CT stone study I independently visualized and interpreted imaging. I agree with the radiologist interpretation   Medicines ordered and prescription drug management: Meds ordered this encounter  Medications   ketorolac (TORADOL) 15 MG/ML injection 15 mg   ondansetron (ZOFRAN) injection 4 mg   lactated ringers bolus 1,000 mL    -I have reviewed the patients home  medicines and have made adjustments as needed  Critical interventions none    Cardiac Monitoring: The patient was maintained on a cardiac monitor.  I personally viewed and interpreted the cardiac monitored which showed an underlying rhythm of: NSR  Social Determinants of Health:  Factors impacting patients care include: none   Reevaluation: After the interventions noted above, I reevaluated the patient and found that they have :improved  Co morbidities that complicate the patient evaluation History reviewed. No pertinent past medical history.    Dispostion: I considered admission for this patient, and disposition pending reevaluation by oncoming provider and completion of urinalysis.  Anticipate discharge.    Final Clinical Impression(s) / ED Diagnoses Final diagnoses:  None     @PCDICTATION @    Glendora Score, MD 03/14/23 [phone]

## 2023-03-14 ENCOUNTER — Emergency Department (HOSPITAL_COMMUNITY)
Admission: EM | Admit: 2023-03-14 | Discharge: 2023-03-14 | Disposition: A | Discharge status: Home / Self Care | Payer: 59 | Attending: Emergency Medicine | Admitting: Emergency Medicine

## 2023-03-14 ENCOUNTER — Encounter (HOSPITAL_COMMUNITY): Payer: Self-pay

## 2023-03-14 ENCOUNTER — Telehealth (HOSPITAL_COMMUNITY): Payer: Self-pay | Admitting: Student

## 2023-03-14 DIAGNOSIS — N200 Calculus of kidney: Secondary | ICD-10-CM | POA: Diagnosis not present

## 2023-03-14 DIAGNOSIS — N23 Unspecified renal colic: Secondary | ICD-10-CM

## 2023-03-14 DIAGNOSIS — N3001 Acute cystitis with hematuria: Secondary | ICD-10-CM

## 2023-03-14 DIAGNOSIS — R109 Unspecified abdominal pain: Secondary | ICD-10-CM | POA: Diagnosis not present

## 2023-03-14 LAB — CBC WITH DIFFERENTIAL/PLATELET
Abs Immature Granulocytes: 0.04 10*3/uL (ref 0.00–0.07)
Basophils Absolute: 0 10*3/uL (ref 0.0–0.1)
Basophils Relative: 0 %
Eosinophils Absolute: 0.2 10*3/uL (ref 0.0–0.5)
Eosinophils Relative: 2 %
HCT: 37.6 % (ref 36.0–46.0)
Hemoglobin: 11.9 g/dL — ABNORMAL LOW (ref 12.0–15.0)
Immature Granulocytes: 0 %
Lymphocytes Relative: 25 %
Lymphs Abs: 2.7 10*3/uL (ref 0.7–4.0)
MCH: 25.2 pg — ABNORMAL LOW (ref 26.0–34.0)
MCHC: 31.6 g/dL (ref 30.0–36.0)
MCV: 79.5 fL — ABNORMAL LOW (ref 80.0–100.0)
Monocytes Absolute: 0.7 10*3/uL (ref 0.1–1.0)
Monocytes Relative: 7 %
Neutro Abs: 7.2 10*3/uL (ref 1.7–7.7)
Neutrophils Relative %: 66 %
Platelets: 238 10*3/uL (ref 150–400)
RBC: 4.73 MIL/uL (ref 3.87–5.11)
RDW: 15.2 % (ref 11.5–15.5)
WBC: 10.8 10*3/uL — ABNORMAL HIGH (ref 4.0–10.5)
nRBC: 0 % (ref 0.0–0.2)

## 2023-03-14 LAB — URINALYSIS, W/ REFLEX TO CULTURE (INFECTION SUSPECTED)
Bilirubin Urine: NEGATIVE
Glucose, UA: NEGATIVE mg/dL
Ketones, ur: NEGATIVE mg/dL
Nitrite: NEGATIVE
Protein, ur: NEGATIVE mg/dL
Specific Gravity, Urine: 1.024 (ref 1.005–1.030)
pH: 5 (ref 5.0–8.0)

## 2023-03-14 LAB — BASIC METABOLIC PANEL
Anion gap: 8 (ref 5–15)
BUN: 15 mg/dL (ref 6–20)
CO2: 24 mmol/L (ref 22–32)
Calcium: 9 mg/dL (ref 8.9–10.3)
Chloride: 107 mmol/L (ref 98–111)
Creatinine, Ser: 1.37 mg/dL — ABNORMAL HIGH (ref 0.44–1.00)
GFR, Estimated: 52 mL/min — ABNORMAL LOW (ref >60)
Glucose, Bld: 113 mg/dL — ABNORMAL HIGH (ref 70–99)
Potassium: 3.4 mmol/L — ABNORMAL LOW (ref 3.5–5.1)
Sodium: 139 mmol/L (ref 135–145)

## 2023-03-14 MED ORDER — KETOROLAC TROMETHAMINE 15 MG/ML IJ SOLN
15.0000 mg | Freq: Once | INTRAMUSCULAR | Status: AC
  Administered 2023-03-14: 15 mg via INTRAVENOUS
  Filled 2023-03-14: qty 1

## 2023-03-14 MED ORDER — KETOROLAC TROMETHAMINE 10 MG PO TABS: 10.0000 mg | ORAL_TABLET | Freq: Four times a day (QID) | ORAL | 0 refills | Status: DC | PRN

## 2023-03-14 MED ORDER — CEFADROXIL 500 MG PO CAPS: 500.0000 mg | ORAL_CAPSULE | Freq: Two times a day (BID) | ORAL | 0 refills | Status: AC

## 2023-03-14 MED ORDER — SODIUM CHLORIDE 0.9 % IV BOLUS
1000.0000 mL | Freq: Once | INTRAVENOUS | Status: AC
  Administered 2023-03-14: 1000 mL via INTRAVENOUS

## 2023-03-14 MED ORDER — FENTANYL CITRATE PF 50 MCG/ML IJ SOSY
50.0000 ug | PREFILLED_SYRINGE | Freq: Once | INTRAMUSCULAR | Status: AC
  Administered 2023-03-14: 50 ug via INTRAVENOUS
  Filled 2023-03-14: qty 1

## 2023-03-14 MED ORDER — SODIUM CHLORIDE 0.9 % IV SOLN
1.0000 g | Freq: Once | INTRAVENOUS | Status: AC
  Administered 2023-03-14: 1 g via INTRAVENOUS
  Filled 2023-03-14: qty 10

## 2023-03-14 MED ORDER — FLUCONAZOLE 150 MG PO TABS: 150.0000 mg | ORAL_TABLET | Freq: Once | ORAL | 0 refills | Status: AC

## 2023-03-14 NOTE — ED Provider Notes (Signed)
[place] EMERGENCY DEPARTMENT AT [facility] Provider Note   CSN: [id number] Arrival date & time: 03/14/23  1714     History  Chief Complaint  Patient presents with   Flank Pain    Anita Jacobson is a 32 y.o. female.  She does not have chronic medical problems but diagnosed with kidney stone due to the left proximal ureter yesterday.  She started having worsening pain today that was not controlled with home meds including Tylenol, paroxetine and oxycodone so she presented to the ER again.   Flank Pain       Home Medications Prior to Admission medications   Medication Sig Start Date End Date Taking? Authorizing Provider  acetaminophen (TYLENOL) 500 MG tablet Take 2 tablets (1,000 mg total) by mouth every 8 (eight) hours. 03/13/23 04/12/23  Kommor, Madison, MD  cefadroxil (DURICEF) 500 MG capsule Take 1 capsule (500 mg total) by mouth 2 (two) times daily for 7 days. 03/14/23 03/21/23  Kommor, Madison, MD  methocarbamol (ROBAXIN) 500 MG tablet Take 1 tablet (500 mg total) by mouth 2 (two) times daily. 03/24/22   Clark, Meghan R, PA-C  naproxen (NAPROSYN) 375 MG tablet Take 1 tablet (375 mg total) by mouth 2 (two) times daily. 03/13/23   Kommor, Madison, MD  oxyCODONE (ROXICODONE) 5 MG immediate release tablet Take 1 tablet (5 mg total) by mouth every 6 (six) hours as needed for severe pain (pain score 7-10) or breakthrough pain. 03/13/23   Kommor, Wyn Forster, MD      Allergies    Patient has no known allergies.    Review of Systems   Review of Systems  Genitourinary:  Positive for flank pain.    Physical Exam Updated Vital Signs BP (!) 134/98 (BP Location: Right Arm)   Pulse 83   Temp 98.1 F (36.7 C) (Oral)   Resp 18   Ht 5\' 6"  (1.676 m)   Wt 113.4 kg   LMP 02/13/2023   SpO2 100%   BMI 40.35 kg/m  Physical Exam Vitals and nursing note reviewed.  Constitutional:      General: She is not in acute distress.    Appearance: She is well-developed.  HENT:      Head: Normocephalic and atraumatic.     Mouth/Throat:     Mouth: Mucous membranes are moist.  Eyes:     Conjunctiva/sclera: Conjunctivae normal.  Cardiovascular:     Rate and Rhythm: Normal rate and regular rhythm.     Heart sounds: No murmur heard. Pulmonary:     Effort: Pulmonary effort is normal. No respiratory distress.     Breath sounds: Normal breath sounds.  Abdominal:     Palpations: Abdomen is soft.     Tenderness: There is no abdominal tenderness. There is no guarding or rebound.     Comments: Mild left mid flank tenderness  Musculoskeletal:        General: No swelling.     Cervical back: Neck supple.  Skin:    General: Skin is warm and dry.     Capillary Refill: Capillary refill takes less than 2 seconds.  Neurological:     General: No focal deficit present.     Mental Status: She is alert and oriented to person, place, and time.  Psychiatric:        Mood and Affect: Mood normal.     ED Results / Procedures / Treatments   Labs (all labs ordered are listed, but only abnormal results are displayed) Labs  Reviewed - No data to display  EKG None  Radiology CT Renal Stone Study Result Date: 03/13/2023 CLINICAL DATA:  Left-sided flank pain and vomiting EXAM: CT ABDOMEN AND PELVIS WITHOUT CONTRAST TECHNIQUE: Multidetector CT imaging of the abdomen and pelvis was performed following the standard protocol without IV contrast. RADIATION DOSE REDUCTION: This exam was performed according to the departmental dose-optimization program which includes automated exposure control, adjustment of the mA and/or kV according to patient size and/or use of iterative reconstruction technique. COMPARISON:  None Available. FINDINGS: Lower chest: No acute abnormality. Hepatobiliary: Unremarkable liver. Normal gallbladder. No biliary dilation. Pancreas: Unremarkable. Spleen: Unremarkable. Adrenals/Urinary Tract: Normal adrenal glands. 5 mm stone in the proximal left ureter. No significant  hydronephrosis. Additional nonobstructing stone in the lower pole of the left kidney. Bladder is unremarkable. Stomach/Bowel: Normal caliber large and small bowel. No bowel wall thickening. The appendix is normal.Stomach is within normal limits. Vascular/Lymphatic: No significant vascular findings are present. No enlarged abdominal or pelvic lymph nodes. Reproductive: Dominant follicle in the right ovary. No follow-up recommended. Left ovary and uterus are unremarkable. Other: No free intraperitoneal fluid or air. Musculoskeletal: No acute fracture. IMPRESSION: 1. 5 mm stone in the proximal left ureter. No significant hydronephrosis. 2. Additional nonobstructing stone in the lower pole of the left kidney. Electronically Signed   By: Minerva Fester M.D.   On: 03/13/2023 20:56    Procedures Procedures    Medications Ordered in ED Medications - No data to display  ED Course/ Medical Decision Making/ A&P Clinical Course as of 03/14/23 Valene Bors Mar 14, 2023  1845 This is a 32 year old female presents for continued left flank pain not controlled with pain medications at home, diagnosed last night with a proximal 5 mm kidney stone.  Her labs show improvement of her leukocytosis since last night but she does now have an AKI, getting IV fluids, awaiting UA.  Pain well-controlled with initial dose of Toradol.  Plan to sign out to oncoming provider pending urine. [CB]    Clinical Course User Index [CB] Ma Rings, PA-C                                 Medical Decision Making Differential diagnosis includes but not limited to ureteral colic, sepsis, muscle strain, other  Course: As above, scented to oncoming  PA Lorin Roemhildt.   Amount and/or Complexity of Data Reviewed Labs: ordered.  Risk Prescription drug management.           Final Clinical Impression(s) / ED Diagnoses Final diagnoses:  None    Rx / DC Orders ED Discharge Orders     None         Ma Rings, PA-C 03/14/23 1905    Loetta Rough, MD 03/14/23 2317

## 2023-03-14 NOTE — ED Provider Notes (Signed)
Accepted handoff at shift change from [facility]. Please see prior provider note for full HPI.  Briefly: Patient is a 32 y.o. female who presents to the ER for ongoing pain after being diagnosed with kidney stone yesterday. Went home and did okay with naproxen but pain returned and oxycodone did not improve pain.   DDX/Plan: Labs show mild AKI today, giving IV fluids. Feels much better after toradol. Plan to follow up on UA. Plan for urology consult to set up follow up. Anticipate dc to home if pain controlled.   Physical Exam  BP (!) 134/98 (BP Location: Right Arm)   Pulse 83   Temp 98.1 F (36.7 C) (Oral)   Resp 18   Ht 5\' 6"  (1.676 m)   Wt 113.4 kg   LMP 02/13/2023   SpO2 100%   BMI 40.35 kg/m   Physical Exam Vitals and nursing note reviewed.  Constitutional:      Appearance: Normal appearance.  HENT:     Head: Normocephalic and atraumatic.  Eyes:     Conjunctiva/sclera: Conjunctivae normal.  Pulmonary:     Effort: Pulmonary effort is normal. No respiratory distress.  Skin:    General: Skin is warm and dry.  Neurological:     Mental Status: She is alert.  Psychiatric:        Mood and Affect: Mood normal.        Behavior: Behavior normal.     Results   Results for orders placed or performed during the hospital encounter of 03/14/23  CBC with Differential   Collection Time: 03/14/23  5:54 PM  Result Value Ref Range   WBC 10.8 (H) 4.0 - 10.5 K/uL   RBC 4.73 3.87 - 5.11 MIL/uL   Hemoglobin 11.9 (L) 12.0 - 15.0 g/dL   HCT 40.9 81.1 - 91.4 %   MCV 79.5 (L) 80.0 - 100.0 fL   MCH 25.2 (L) 26.0 - 34.0 pg   MCHC 31.6 30.0 - 36.0 g/dL   RDW 78.2 95.6 - 21.3 %   Platelets 238 150 - 400 K/uL   nRBC 0.0 0.0 - 0.2 %   Neutrophils Relative % 66 %   Neutro Abs 7.2 1.7 - 7.7 K/uL   Lymphocytes Relative 25 %   Lymphs Abs 2.7 0.7 - 4.0 K/uL   Monocytes Relative 7 %   Monocytes Absolute 0.7 0.1 - 1.0 K/uL   Eosinophils Relative 2 %   Eosinophils Absolute 0.2 0.0 - 0.5  K/uL   Basophils Relative 0 %   Basophils Absolute 0.0 0.0 - 0.1 K/uL   Immature Granulocytes 0 %   Abs Immature Granulocytes 0.04 0.00 - 0.07 K/uL  Basic metabolic panel   Collection Time: 03/14/23  5:54 PM  Result Value Ref Range   Sodium 139 135 - 145 mmol/L   Potassium 3.4 (L) 3.5 - 5.1 mmol/L   Chloride 107 98 - 111 mmol/L   CO2 24 22 - 32 mmol/L   Glucose, Bld 113 (H) 70 - 99 mg/dL   BUN 15 6 - 20 mg/dL   Creatinine, Ser 0.86 (H) 0.44 - 1.00 mg/dL   Calcium 9.0 8.9 - 57.8 mg/dL   GFR, Estimated 52 (L) >60 mL/min   Anion gap 8 5 - 15   CT Renal Stone Study Result Date: 03/13/2023 CLINICAL DATA:  Left-sided flank pain and vomiting EXAM: CT ABDOMEN AND PELVIS WITHOUT CONTRAST TECHNIQUE: Multidetector CT imaging of the abdomen and pelvis was performed following the standard protocol without IV  contrast. RADIATION DOSE REDUCTION: This exam was performed according to the departmental dose-optimization program which includes automated exposure control, adjustment of the mA and/or kV according to patient size and/or use of iterative reconstruction technique. COMPARISON:  None Available. FINDINGS: Lower chest: No acute abnormality. Hepatobiliary: Unremarkable liver. Normal gallbladder. No biliary dilation. Pancreas: Unremarkable. Spleen: Unremarkable. Adrenals/Urinary Tract: Normal adrenal glands. 5 mm stone in the proximal left ureter. No significant hydronephrosis. Additional nonobstructing stone in the lower pole of the left kidney. Bladder is unremarkable. Stomach/Bowel: Normal caliber large and small bowel. No bowel wall thickening. The appendix is normal.Stomach is within normal limits. Vascular/Lymphatic: No significant vascular findings are present. No enlarged abdominal or pelvic lymph nodes. Reproductive: Dominant follicle in the right ovary. No follow-up recommended. Left ovary and uterus are unremarkable. Other: No free intraperitoneal fluid or air. Musculoskeletal: No acute fracture.  IMPRESSION: 1. 5 mm stone in the proximal left ureter. No significant hydronephrosis. 2. Additional nonobstructing stone in the lower pole of the left kidney. Electronically Signed   By: Minerva Fester M.D.   On: 03/13/2023 20:56   ED Course / MDM   Clinical Course as of 03/14/23 2010  Sun Mar 14, 2023  1845 This is a 32 year old female presents for continued left flank pain not controlled with pain medications at home, diagnosed last night with a proximal 5 mm kidney stone.  Her labs show improvement of her leukocytosis since last night but she does now have an AKI, getting IV fluids, awaiting UA.  Pain well-controlled with initial dose of Toradol.  Plan to sign out to oncoming provider pending urine. [CB]  2009 Notified by nurse patient's pain is returning. She received only 15 mg/mL toradol earlier, will order an additional 15.  [LR]    Clinical Course User Index [CB] Cristi Loron, Celeste A, PA-C [LR] Nolan Lasser, Lora Paula, PA-C   Medical Decision Making Amount and/or Complexity of Data Reviewed Labs: ordered.  Risk Prescription drug management.  I consulted with urologist Dr Alvester Morin who feels patient can discharge to home with antibiotics and pain control. Recommends following up in clinic as soon as possible, recommended giving contact information for [facility].  Patient had omnicef called in by provider last night, will recommend continuing this. Given initial dose of rocephin as patient is unable to pick up medications tonight.  She is better controlled with toradol than meds prescribed last night. Will discontinue naproxen and prescribe 10 mg toradol Q6. Strongly encouraged to push fluids and hydrate orally with her mild increase in kidney function.   Patient had concerns about pain and not being able to pick up medications tonight so will give last dose of IV pain medications. I offered possible admission for antibiotics and pain control and patient declined, prefers  to try pain control at home. Patient discharged in stable condition and all questions answered.    Jeanella Flattery 03/14/23 2153    Loetta Rough, MD 03/14/23 2317

## 2023-03-14 NOTE — ED Provider Notes (Signed)
[place] EMERGENCY DEPARTMENT AT [facility] Provider Note   CSN: [id number] Arrival date & time: 03/14/23  1714     History  Chief Complaint  Patient presents with   Flank Pain    Anita Jacobson is a 32 y.o. female who presents to the ER for left flank pain.  She was seen last night and diagnosed with a kidney stone.  She went home with naproxen, Tylenol and oxycodone.  She states she taken the naproxen and oxycodone today with good relief but the pain became more severe again in the left mid flank area and she took oxycodone with no relief so came back to the ER due to the severity of her pain.  Denies fevers or chills.   Flank Pain       Home Medications Prior to Admission medications   Medication Sig Start Date End Date Taking? Authorizing Provider  acetaminophen (TYLENOL) 500 MG tablet Take 2 tablets (1,000 mg total) by mouth every 8 (eight) hours. 03/13/23 04/12/23  Kommor, Madison, MD  cefadroxil (DURICEF) 500 MG capsule Take 1 capsule (500 mg total) by mouth 2 (two) times daily for 7 days. 03/14/23 03/21/23  Kommor, Madison, MD  methocarbamol (ROBAXIN) 500 MG tablet Take 1 tablet (500 mg total) by mouth 2 (two) times daily. 03/24/22   Clark, Meghan R, PA-C  naproxen (NAPROSYN) 375 MG tablet Take 1 tablet (375 mg total) by mouth 2 (two) times daily. 03/13/23   Kommor, Madison, MD  oxyCODONE (ROXICODONE) 5 MG immediate release tablet Take 1 tablet (5 mg total) by mouth every 6 (six) hours as needed for severe pain (pain score 7-10) or breakthrough pain. 03/13/23   Kommor, Wyn Forster, MD      Allergies    Patient has no known allergies.    Review of Systems   Review of Systems  Genitourinary:  Positive for flank pain.    Physical Exam Updated Vital Signs BP (!) 134/98 (BP Location: Right Arm)   Pulse 83   Temp 98.1 F (36.7 C) (Oral)   Resp 18   Ht 5\' 6"  (1.676 m)   Wt 113.4 kg   LMP 02/13/2023   SpO2 100%   BMI 40.35 kg/m  Physical Exam Vitals  and nursing note reviewed.  Constitutional:      General: She is not in acute distress.    Appearance: She is well-developed.  HENT:     Head: Normocephalic and atraumatic.  Eyes:     Extraocular Movements: Extraocular movements intact.     Conjunctiva/sclera: Conjunctivae normal.     Pupils: Pupils are equal, round, and reactive to light.  Cardiovascular:     Rate and Rhythm: Normal rate and regular rhythm.     Heart sounds: No murmur heard. Pulmonary:     Effort: Pulmonary effort is normal. No respiratory distress.     Breath sounds: Normal breath sounds.  Abdominal:     Palpations: Abdomen is soft.     Tenderness: There is no abdominal tenderness.  Musculoskeletal:        General: No swelling.     Cervical back: Neck supple.  Skin:    General: Skin is warm and dry.     Capillary Refill: Capillary refill takes less than 2 seconds.  Neurological:     General: No focal deficit present.     Mental Status: She is alert and oriented to person, place, and time.  Psychiatric:        Mood and Affect:  Mood normal.     ED Results / Procedures / Treatments   Labs (all labs ordered are listed, but only abnormal results are displayed) Labs Reviewed  CBC WITH DIFFERENTIAL/PLATELET - Abnormal; Notable for the following components:      Result Value   WBC 10.8 (*)    Hemoglobin 11.9 (*)    MCV 79.5 (*)    MCH 25.2 (*)    All other components within normal limits  BASIC METABOLIC PANEL - Abnormal; Notable for the following components:   Potassium 3.4 (*)    Glucose, Bld 113 (*)    Creatinine, Ser 1.37 (*)    GFR, Estimated 52 (*)    All other components within normal limits  URINALYSIS, W/ REFLEX TO CULTURE (INFECTION SUSPECTED)    EKG None  Radiology CT Renal Stone Study Result Date: 03/13/2023 CLINICAL DATA:  Left-sided flank pain and vomiting EXAM: CT ABDOMEN AND PELVIS WITHOUT CONTRAST TECHNIQUE: Multidetector CT imaging of the abdomen and pelvis was performed  following the standard protocol without IV contrast. RADIATION DOSE REDUCTION: This exam was performed according to the departmental dose-optimization program which includes automated exposure control, adjustment of the mA and/or kV according to patient size and/or use of iterative reconstruction technique. COMPARISON:  None Available. FINDINGS: Lower chest: No acute abnormality. Hepatobiliary: Unremarkable liver. Normal gallbladder. No biliary dilation. Pancreas: Unremarkable. Spleen: Unremarkable. Adrenals/Urinary Tract: Normal adrenal glands. 5 mm stone in the proximal left ureter. No significant hydronephrosis. Additional nonobstructing stone in the lower pole of the left kidney. Bladder is unremarkable. Stomach/Bowel: Normal caliber large and small bowel. No bowel wall thickening. The appendix is normal.Stomach is within normal limits. Vascular/Lymphatic: No significant vascular findings are present. No enlarged abdominal or pelvic lymph nodes. Reproductive: Dominant follicle in the right ovary. No follow-up recommended. Left ovary and uterus are unremarkable. Other: No free intraperitoneal fluid or air. Musculoskeletal: No acute fracture. IMPRESSION: 1. 5 mm stone in the proximal left ureter. No significant hydronephrosis. 2. Additional nonobstructing stone in the lower pole of the left kidney. Electronically Signed   By: Minerva Fester M.D.   On: 03/13/2023 20:56    Procedures Procedures    Medications Ordered in ED Medications  sodium chloride 0.9 % bolus 1,000 mL (has no administration in time range)  ketorolac (TORADOL) 15 MG/ML injection 15 mg (15 mg Intravenous Given 03/14/23 1757)    ED Course/ Medical Decision Making/ A&P Clinical Course as of 03/14/23 1853  Sun Mar 14, 2023  1845 This is a 32 year old female presents for continued left flank pain not controlled with pain medications at home, diagnosed last night with a proximal 5 mm kidney stone.  Her labs show improvement of her  leukocytosis since last night but she does now have an AKI, getting IV fluids, awaiting UA.  Pain well-controlled with initial dose of Toradol.  Plan to sign out to oncoming provider pending urine. [CB]    Clinical Course User Index [CB] Ma Rings, PA-C                                 Medical Decision Making Amount and/or Complexity of Data Reviewed Labs: ordered.  Risk Prescription drug management.           Final Clinical Impression(s) / ED Diagnoses Final diagnoses:  None    Rx / DC Orders ED Discharge Orders     None  Ma Rings, PA-C 03/14/23 1858    Loetta Rough, MD 03/14/23 [phone]

## 2023-03-14 NOTE — Telephone Encounter (Signed)
Note created to send antibiotics to pharmacy 

## 2023-03-14 NOTE — ED Notes (Signed)
Pt unable to provide urine specimen at this time

## 2023-03-14 NOTE — Discharge Instructions (Addendum)
You were seen in the emergency department for ongoing flank pain.  I am glad that you are feeling better with Toradol.  Your labs showed a mild kidney injury, but we normally treat this with IV fluids, and encourage good fluid intake at home. Make sure you are drinking lots of water.   I discussed you with the on-call urologist, and they like to see you in clinic for follow-up.  They have 2 offices, 1 in [place], and 1 in [place].  They recommend calling the office in [place] tomorrow morning, and they may be able to see you same day, but if they do not have any openings you could go to the office in [place], and I have attached their contact info as well.  I have sent a prescription for toradol to take every 6 hours. You can discontinue the naproxen in the mean time. I recommend taking a dose of toradol, 2 hours later tylenol, 2 hours later oxycodone, and titrate/alter this with what you find best controls your pain. The provider last night sent a prescription of antibiotics, and we have given you the first dose this evening.  Continue to monitor how you're doing and return to the ER for new or worsening symptoms.

## 2023-03-14 NOTE — ED Triage Notes (Signed)
Pt diagnosed with a kidney stone yesterday and has been taking oxycodone but is still not finding any relief.

## 2023-03-16 DIAGNOSIS — K5900 Constipation, unspecified: Secondary | ICD-10-CM | POA: Diagnosis not present

## 2023-03-16 DIAGNOSIS — N390 Urinary tract infection, site not specified: Secondary | ICD-10-CM | POA: Diagnosis not present

## 2023-03-16 DIAGNOSIS — A09 Infectious gastroenteritis and colitis, unspecified: Secondary | ICD-10-CM | POA: Diagnosis not present

## 2023-03-16 DIAGNOSIS — N200 Calculus of kidney: Secondary | ICD-10-CM | POA: Diagnosis not present

## 2023-03-16 DIAGNOSIS — K819 Cholecystitis, unspecified: Secondary | ICD-10-CM | POA: Diagnosis not present

## 2023-03-16 DIAGNOSIS — K3580 Unspecified acute appendicitis: Secondary | ICD-10-CM | POA: Diagnosis not present

## 2023-03-29 DIAGNOSIS — M2040 Other hammer toe(s) (acquired), unspecified foot: Secondary | ICD-10-CM | POA: Insufficient documentation

## 2023-03-29 DIAGNOSIS — M2010 Hallux valgus (acquired), unspecified foot: Secondary | ICD-10-CM | POA: Insufficient documentation

## 2023-03-29 NOTE — Progress Notes (Deleted)
 Name: Anita Jacobson DOB: 07/09/1990 MRN: [id number]  History of Present Illness: Ms. Anita Jacobson is a 32 y.o. female who presents today as a new patient at [facility] [place]. All available relevant medical records have been reviewed.  ***She is accompanied by ***.  She reports concern of kidney stone(s).  She denies prior history of kidney stones.  Recent history: > 03/13/2023:  - Seen in ER for left flank pain. CMP with normal creatinine (0.71). CBC with leukocytosis (WBC 13.7). UA showed 11-20 WBC/hpf, >50 RBC/hpf, bacteria (many), amorphous urates.  CT showed a 5 mm stone in the proximal left ureter with no significant hydronephrosis.   She was discharged with prescription for Oxycodone  5 mg (#10 dispensed 03/14/2023 per PMP aware controlled substance registry).  > 03/14/2023:  - Returned to ER for uncontrolled left flank pain. CMP with mildly elevated creatinine (1.37) indicating mild AKI. Treated with IV fluids.  UA showed 21-50 WBC/hpf, 21-50 RBC/hpf, bacteria (many).  Patient had omnicef called in by provider last night, will recommend continuing this. ***rx'd by whom? Rx not found in chart history  - Pain improved with Toradol , which was prescribed upon discharge.  - Strongly encouraged to push fluids and hydrate orally with her mild increase in kidney function.  > No urine culture sent at either ER visit.  Today: She {Actions; denies-reports:120008} passing the stone. She {Actions; denies-reports:120008} flank pain or abdominal pain. She {Actions; denies-reports:120008} fevers, nausea, or vomiting.  She {Actions; denies-reports:120008} increased urinary urgency, frequency, nocturia, dysuria, gross hematuria, hesitancy, straining to void, or sensations of incomplete emptying.  ***recheck BMP to confirm resolution of mild AKI ***KUB & RUS ***rx Flomax  0.4 mg daily for MET ***urine culture? ***complete Omnicef as prescribed elsewhere  Fall  Screening: Do you usually have a device to assist in your mobility? {yes/no:20286} ***cane / ***walker / ***wheelchair  Medications: Current Outpatient Medications  Medication Sig Dispense Refill   acetaminophen  (TYLENOL ) 500 MG tablet Take 2 tablets (1,000 mg total) by mouth every 8 (eight) hours. 180 tablet 0   ketorolac  (TORADOL ) 10 MG tablet Take 1 tablet (10 mg total) by mouth every 6 (six) hours as needed. 20 tablet 0   methocarbamol  (ROBAXIN ) 500 MG tablet Take 1 tablet (500 mg total) by mouth 2 (two) times daily. 20 tablet 0   oxyCODONE  (ROXICODONE ) 5 MG immediate release tablet Take 1 tablet (5 mg total) by mouth every 6 (six) hours as needed for severe pain (pain score 7-10) or breakthrough pain. 10 tablet 0   No current facility-administered medications for this visit.    Allergies: No Known Allergies  No past medical history on file. Past Surgical History:  Procedure Laterality Date   TONSILLECTOMY     TYMPANOSTOMY TUBE PLACEMENT     No family history on file. Social History   Socioeconomic History   Marital status: Single    Spouse name: Not on file   Number of children: Not on file   Years of education: Not on file   Highest education level: Not on file  Occupational History   Not on file  Tobacco Use   Smoking status: Never   Smokeless tobacco: Never  Vaping Use   Vaping status: Never Used  Substance and Sexual Activity   Alcohol use: Not Currently   Drug use: Yes    Types: Marijuana   Sexual activity: Not on file  Other Topics Concern   Not on file  Social History Narrative   Not on file  Social Drivers of Corporate Investment Banker Strain: Not on file  Food Insecurity: Not on file  Transportation Needs: Not on file  Physical Activity: Not on file  Stress: Not on file  Social Connections: Not on file  Intimate Partner Violence: Not on file    SUBJECTIVE  Review of Systems Constitutional: Patient denies any unintentional weight loss or  change in strength lntegumentary: Patient denies any rashes or pruritus Cardiovascular: Patient denies chest pain or syncope Respiratory: Patient denies shortness of breath Gastrointestinal: Patient ***denies nausea, vomiting, constipation, or diarrhea Musculoskeletal: Patient denies muscle cramps or weakness Neurologic: Patient denies convulsions or seizures Allergic/Immunologic: Patient denies recent allergic reaction(s) Hematologic/Lymphatic: Patient denies bleeding tendencies Endocrine: Patient denies heat/cold intolerance  GU: As per HPI.  OBJECTIVE There were no vitals filed for this visit. There is no height or weight on file to calculate BMI.  Physical Examination Constitutional: No obvious distress; patient is non-toxic appearing  Cardiovascular: No visible lower extremity edema.  Respiratory: The patient does not have audible wheezing/stridor; respirations do not appear labored  Gastrointestinal: Abdomen non-distended Musculoskeletal: Normal ROM of UEs  Skin: No obvious rashes/open sores  Neurologic: CN 2-12 grossly intact Psychiatric: Answered questions appropriately with normal affect  Hematologic/Lymphatic/Immunologic: No obvious bruises or sites of spontaneous bleeding  Urine microscopy: ***negative *** WBC/hpf, *** RBC/hpf, *** bacteria UA: ***negative *** WBC/hpf, *** RBC/hpf, *** bacteria ***with no evidence of UTI ***with no evidence of microscopic hematuria ***otherwise unremarkable  PVR: *** ml  ASSESSMENT No diagnosis found.  ***We reviewed recent imaging results; ***awaiting radiology results, appears to have ***no acute findings per provider interpretation.  ***Advised adequate hydration and we discussed option to consider low oxalate diet given that calcium oxalate is the most common type of stone. Handout provided about stone prevention diet.  For acute GU stone symptoms we agreed to proceed with: - ***RUS and KUB / ***CT stone study to evaluate  current stone burden.  - ***CMP to assess kidney function. - ***Flomax  0.4 mg daily for medical expulsive therapy (MET), which may improve passage of stone(s).  - For pain management, we discussed the use of opioids versus OTC analgesics. A 5 day prescription was sent for ***Percocet for PRN use for severe pain.  - For nausea / vomiting, a prescription was sent for ***Zofran  / ***Phenergan for PRN use.  ***We discussed the various treatment options including ***medical expulsive therapy (MET), ***extracorporeal shock wave lithotripsy (ESWL), ureteroscopic stone manipulation (URS), or ***percutaneous nephrolithotomy (PCNL). We discussed possible risks and benefits of intervention including but not limited to: including pain, infection, sepsis, UTI, ureter perforation, need for stenting, post-op ureteral stricture, hematuria.  ***Will consult with ***Dr. Sherrilee and notify patient of his recommendations ***for next steps / ***following review of imaging results.  Will plan to follow up in ***2 weeks ***6 months with ***KUB ***RUS for stone surveillance or sooner if needed.   She was advised to contact urology provider or go to the ER if She develops fever >101F, uncontrollable pain, or other significantly concerning symptoms prior to next office visit.  She verbalized understanding and agreement. All questions were answered.   PLAN Advised the following: ***Flomax  daily x2 weeks. ***Analgesics PRN for pain. ***Zofran  PRN for nausea. ***No follow-ups on file.  No orders of the defined types were placed in this encounter.   It has been explained that the patient is to follow regularly with their PCP in addition to all other providers involved in their care and to follow  instructions provided by these respective offices. Patient advised to contact urology clinic if any urologic-pertaining questions, concerns, new symptoms or problems arise in the interim period.  There are no Patient  Instructions on file for this visit.  Electronically signed by: Lauraine KYM Oz, MSN, FNP-C, CUNP 03/29/2023 12:51 PM

## 2023-03-31 DIAGNOSIS — Z419 Encounter for procedure for purposes other than remedying health state, unspecified: Secondary | ICD-10-CM | POA: Diagnosis not present

## 2023-04-05 ENCOUNTER — Ambulatory Visit: Payer: 59 | Admitting: Urology

## 2023-04-05 DIAGNOSIS — N200 Calculus of kidney: Secondary | ICD-10-CM | POA: Insufficient documentation

## 2023-04-05 DIAGNOSIS — R8290 Unspecified abnormal findings in urine: Secondary | ICD-10-CM

## 2023-04-05 DIAGNOSIS — R109 Unspecified abdominal pain: Secondary | ICD-10-CM

## 2023-04-05 DIAGNOSIS — N179 Acute kidney failure, unspecified: Secondary | ICD-10-CM

## 2023-04-05 DIAGNOSIS — N201 Calculus of ureter: Secondary | ICD-10-CM

## 2023-04-05 NOTE — Progress Notes (Signed)
 Name: Anita Jacobson DOB: Feb 14, 1991 MRN: [id number]  History of Present Illness: Anita Jacobson is a 33 y.o. female who presents today as a new patient at [facility]. All available relevant medical records have been reviewed.   She reports concern of kidney stone(s).  She denies prior history of kidney stones.  Recent history: > 03/13/2023:  - Seen in ER for left flank pain. CMP with normal creatinine (0.71). CBC with leukocytosis (WBC 13.7). UA showed 11-20 WBC/hpf, >50 RBC/hpf, bacteria (many), amorphous urates.  CT showed a 5 mm stone in the proximal left ureter with no significant hydronephrosis.   She was discharged with prescription for Oxycodone  5 mg (#10 dispensed 03/14/2023 per PMP aware controlled substance registry).  > 03/14/2023:  - Returned to ER for uncontrolled left flank pain. CMP with mildly elevated creatinine (1.37) indicating mild AKI. Treated with IV fluids.  UA showed 21-50 WBC/hpf, 21-50 RBC/hpf, bacteria (many).  Patient had omnicef called in by provider last night, will recommend continuing this.  - Pain improved with Toradol , which was prescribed upon discharge.  - Strongly encouraged to push fluids and hydrate orally with her mild increase in kidney function.  > No urine culture sent at either ER visit.  Today: She reports that she's pretty confident the stone passed on 2024/03/22. She reports some persistent mild intermittent left flank pain since then. Denies abdominal pain, fevers, nausea, or vomiting. Reports some increased urinary urgency, frequency, and nocturia. She denies dysuria, gross hematuria, hesitancy, straining to void, or sensations of incomplete emptying.   Fall Screening: Do you usually have a device to assist in your mobility? No   Medications: Current Outpatient Medications  Medication Sig Dispense Refill   acetaminophen  (TYLENOL ) 500 MG tablet Take 2 tablets (1,000 mg total) by mouth every 8 (eight) hours. 180  tablet 0   ketorolac  (TORADOL ) 10 MG tablet Take 1 tablet (10 mg total) by mouth every 6 (six) hours as needed. 20 tablet 0   methocarbamol  (ROBAXIN ) 500 MG tablet Take 1 tablet (500 mg total) by mouth 2 (two) times daily. 20 tablet 0   oxyCODONE  (ROXICODONE ) 5 MG immediate release tablet Take 1 tablet (5 mg total) by mouth every 6 (six) hours as needed for severe pain (pain score 7-10) or breakthrough pain. 10 tablet 0   No current facility-administered medications for this visit.    Allergies: No Known Allergies  No past medical history on file. Past Surgical History:  Procedure Laterality Date   TONSILLECTOMY     TYMPANOSTOMY TUBE PLACEMENT     No family history on file. Social History   Socioeconomic History   Marital status: Single    Spouse name: Not on file   Number of children: Not on file   Years of education: Not on file   Highest education level: Not on file  Occupational History   Not on file  Tobacco Use   Smoking status: Never   Smokeless tobacco: Never  Vaping Use   Vaping status: Never Used  Substance and Sexual Activity   Alcohol use: Not Currently   Drug use: Yes    Types: Marijuana   Sexual activity: Not on file  Other Topics Concern   Not on file  Social History Narrative   Not on file   Social Drivers of Health   Financial Resource Strain: Not on file  Food Insecurity: Not on file  Transportation Needs: Not on file  Physical Activity: Not on file  Stress:  Not on file  Social Connections: Not on file  Intimate Partner Violence: Not on file    SUBJECTIVE  Review of Systems Constitutional: Patient denies any unintentional weight loss or change in strength lntegumentary: Patient denies any rashes or pruritus Cardiovascular: Patient denies chest pain or syncope Respiratory: Patient denies shortness of breath Gastrointestinal: Patient denies nausea, vomiting, constipation, or diarrhea Musculoskeletal: Patient denies muscle cramps or  weakness Neurologic: Patient denies convulsions or seizures Allergic/Immunologic: Patient denies recent allergic reaction(s) Hematologic/Lymphatic: Patient denies bleeding tendencies Endocrine: Patient denies heat/cold intolerance  GU: As per HPI.  OBJECTIVE Vitals:   04/08/23 1307  BP: (!) 146/93  Pulse: 76   There is no height or weight on file to calculate BMI.  Physical Examination Constitutional: No obvious distress; patient is non-toxic appearing  Cardiovascular: No visible lower extremity edema.  Respiratory: The patient does not have audible wheezing/stridor; respirations do not appear labored  Gastrointestinal: Abdomen non-distended Musculoskeletal: Normal ROM of UEs  Skin: No obvious rashes/open sores  Neurologic: CN 2-12 grossly intact Psychiatric: Answered questions appropriately with normal affect  Hematologic/Lymphatic/Immunologic: No obvious bruises or sites of spontaneous bleeding  Urine microscopy: 6-10 WBC/hpf, 3-10 RBC/hpf, many bacteria  ASSESSMENT Left ureteral stone - Plan: Urinalysis, Routine w reflex microscopic, BLADDER SCAN AMB NON-IMAGING, DG Abd 1 View, DG Abd 1 View  Kidney stones - Plan: Urinalysis, Routine w reflex microscopic, BLADDER SCAN AMB NON-IMAGING, DG Abd 1 View, DG Abd 1 View  We reviewed recent history and results. She is asymptomatic for UTI today and minimally symptomatic otherwise. We agreed to obtain KUB today to confirm left ureteral stone passage. Will plan for follow up in 6 months with KUB for stone surveillance or sooner if needed. Pt verbalized understanding and agreement. All questions were answered.   PLAN Advised the following: KUB today.  Return in about 6 months (around 10/06/2023) for KUB, UA, & f/u with Lauraine Oz NP.  Orders Placed This Encounter  Procedures   DG Abd 1 View    Standing Status:   Future    Expected Date:   04/08/2023    Expiration Date:   04/07/2024    Reason for Exam (SYMPTOM  OR DIAGNOSIS  REQUIRED):   kidney stone    Is patient pregnant?:   Unknown (Please Explain)    Preferred imaging location?:   [facility]   DG Abd 1 View    Standing Status:   Future    Expected Date:   10/06/2023    Expiration Date:   04/07/2024    Reason for Exam (SYMPTOM  OR DIAGNOSIS REQUIRED):   kidney stone    Is patient pregnant?:   Unknown (Please Explain)    Preferred imaging location?:   [facility]   Urinalysis, Routine w reflex microscopic   BLADDER SCAN AMB NON-IMAGING    It has been explained that the patient is to follow regularly with their PCP in addition to all other providers involved in their care and to follow instructions provided by these respective offices. Patient advised to contact urology clinic if any urologic-pertaining questions, concerns, new symptoms or problems arise in the interim period.  Patient Instructions  >80% of stones are calcium oxalate. This type of stones forms when body either isn't clearing oxalate well enough, is making too much oxalate, or too little citrate. This results in oxalate binding to form crystals, which continue to aggregate and form stones.  Limiting calcium does not help, but limiting oxalate in the diet  can help. Increasing citric acid intake may also help.  The following measures may help to prevent the recurrence of stones: Increase water intake to 2-2.5 liters per day May add citrus juice (lemon, lime or orange juice) to water Moderation in dairy foods Decrease in salt content 5. Low Oxalate diet: Oxylates are found in foods like Tomato, Spinach, red wine and chocolate (see additional resources below).  Internet resources for information regarding low oxalate diet: https://kidneystones.yangchunwu.com https://my.verticalstretch.be  Foods Low in Sodium or Oxalate Foods You Can Eat  Drinks Coffee, fruit and veggie juice (using the  recommended veggies), fruit punch  Fruits Apples, apricots (fresh or canned), avocado, bananas, cherries (sweet), cranberries, grapefruit, red or green grapes, lemon and lime juice, melons, nectarines, papayas, peaches, pears, pineapples, oranges, strawberries (fresh), tangerines  Veggies Artichokes, asparagus, bamboo shoots, broccoli, brussels sprouts, cabbage, cauliflower, chayote squash, chicory, corn, cucumbers, endive, lettuce, lima beans, mushrooms, onions, peas, peppers, potatoes, radishes, rutabagas, zucchini  Breads, Cereals, Grains Egg noodles, rye bread, cooked and dry cereals without nuts or bran, crackers with unsalted tops, white or wild rice  Meat, Meat Replacements, Fish, Recruitment Consultant, fish, poultry, eggs, egg whites, egg replacements  Soup Homemade soup (using the recommended veggies and meat), low-sodium bouillon, low-sodium canned  Desserts Cookies, cakes, ice cream, pudding without chocolate or nuts, candy without chocolate or nuts  Fats and Oils Butter, margarine, cream, oil, salad dressing, mayo  Other Foods Unsalted potato chips or pretzels, herbs (like garlic, garlic powder, onion powder), lemon juice, salt-free seasoning blends, vinegar  Other Foods Low in Oxalate Foods You Can Eat  Drinks Beer, cola, wine, buttermilk, lemonade or limeade (without added vitamin C), milk  Meat, Meat Replacements, Fish, [employer] meat, ham, bacon, hot dogs, bratwurst, sausage, chicken nuggets, cheddar cheese, canned fish and shellfish  Soup Tomato soup, cheese soup  Other Foods Coconuts, lemon or lime juices, sugar or sweeteners, jellies or jams (from the recommended list)   Moderate-Oxalate Foods Foods to Limit   Drinks Fruit and veggie juices (from the list below), chocolate milk, rice milk, hot cocoa, tea   Fruits Blackberries, blueberries, black currants, cherries (sour), fruit cocktail, mangoes, orange peel, prunes, purple plums   Veggies Baked beans, carrots, celery, green  beans, parsnips, summer squash, tomatoes, turnips   Breads, Cereals, Grains White bread, cornbread or cornmeal, white English muffins, saltine or soda crackers, brown rice, vanilla wafers, spaghetti and other noodles, firm tofu, bagels, oatmeal   Meat/meat replacements, fish, poultry Sardines   Desserts Chocolate cake   Fats and Oils Macadamia nuts, pistachio nuts, English walnuts   Other Foods Jams or jellies (made with the fruits above), pepper    High-Oxalate Foods Foods to Avoid Drinks Chocolate drink mixes, soy milk, Ovaltine, instant iced tea, fruit juices of fruits listed below Fruits Apricots (dried), red currants, figs, kiwi, plums, rhubarb Veggies Beans (wax, dried), beets and beet greens, chives, collard greens, eggplant, escarole, dark greens of all kinds, leeks, okra, parsley, rutabagas, spinach, Swiss chard, tomato paste, watercress Breads, Cereals, Grains Amaranth, barley, white corn flour, fried potatoes, fruitcake, grits, soybean products, sweet potatoes, wheat germ and bran, buckwheat flour, All Bran cereal, graham crackers, pretzels, whole wheat bread Meat/meat replacements, fish, poultry  Dried beans, peanut butter, soy burgers, miso  Desserts Carob, chocolate, marmalades Fats and Oils Nuts (peanuts, almonds, pecans, cashews, hazelnuts), nut butters, sesame seeds, tahini paste Other Foods Poppy seeds   Electronically signed by: Lauraine KYM Oz, MSN, FNP-C, CUNP 04/08/2023 1:41 PM

## 2023-04-06 DIAGNOSIS — Z0120 Encounter for dental examination and cleaning without abnormal findings: Secondary | ICD-10-CM | POA: Diagnosis not present

## 2023-04-08 ENCOUNTER — Ambulatory Visit (HOSPITAL_COMMUNITY)
Admission: RE | Admit: 2023-04-08 | Discharge: 2023-04-08 | Disposition: A | Discharge status: Home / Self Care | Payer: Medicaid Other | Source: Ambulatory Visit | Attending: Urology | Admitting: Urology

## 2023-04-08 ENCOUNTER — Ambulatory Visit (INDEPENDENT_AMBULATORY_CARE_PROVIDER_SITE_OTHER): Payer: Medicaid Other | Admitting: Urology

## 2023-04-08 ENCOUNTER — Encounter: Payer: Self-pay | Admitting: Urology

## 2023-04-08 VITALS — BP 146/93 | HR 76

## 2023-04-08 DIAGNOSIS — N200 Calculus of kidney: Secondary | ICD-10-CM | POA: Diagnosis not present

## 2023-04-08 DIAGNOSIS — Z09 Encounter for follow-up examination after completed treatment for conditions other than malignant neoplasm: Secondary | ICD-10-CM | POA: Diagnosis not present

## 2023-04-08 DIAGNOSIS — Z87442 Personal history of urinary calculi: Secondary | ICD-10-CM | POA: Diagnosis not present

## 2023-04-08 DIAGNOSIS — N201 Calculus of ureter: Secondary | ICD-10-CM

## 2023-04-08 LAB — URINALYSIS, ROUTINE W REFLEX MICROSCOPIC
Bilirubin, UA: NEGATIVE
Glucose, UA: NEGATIVE
Ketones, UA: NEGATIVE
Nitrite, UA: NEGATIVE
Protein,UA: NEGATIVE
Specific Gravity, UA: 1.02 (ref 1.005–1.030)
Urobilinogen, Ur: 0.2 mg/dL (ref 0.2–1.0)
pH, UA: 6 (ref 5.0–7.5)

## 2023-04-08 LAB — MICROSCOPIC EXAMINATION: Epithelial Cells (non renal): >10 /[HPF] — AB (ref 0–10)

## 2023-04-08 NOTE — Patient Instructions (Signed)

## 2023-04-08 NOTE — Progress Notes (Signed)
 PVR

## 2023-04-09 ENCOUNTER — Other Ambulatory Visit: Payer: Self-pay | Admitting: Urology

## 2023-04-09 DIAGNOSIS — N201 Calculus of ureter: Secondary | ICD-10-CM

## 2023-04-09 MED ORDER — TAMSULOSIN HCL 0.4 MG PO CAPS: 0.4000 mg | ORAL_CAPSULE | Freq: Every day | ORAL | 0 refills | Status: DC

## 2023-04-09 NOTE — Progress Notes (Signed)
 Reviewed KUB images from 04/07/2022 with Dr. Sherrilee. Her 5 mm left ureteral stone is at UVJ. Spoke with patient via phone regarding findings; she remains asymptomatic. Advised medical expulsive therapy with Flomax  0.4 mg daily for 2 weeks then f/u visit with KUB prior. Pt verbalized understanding and agreement. All questions were answered.  Lauraine Oz, MSN, FNP-C, [facility] Nurse Practitioner [facility] [place]

## 2023-04-19 ENCOUNTER — Ambulatory Visit (INDEPENDENT_AMBULATORY_CARE_PROVIDER_SITE_OTHER): Payer: Medicaid Other | Admitting: Urology

## 2023-04-19 ENCOUNTER — Ambulatory Visit (HOSPITAL_COMMUNITY)
Admission: RE | Admit: 2023-04-19 | Discharge: 2023-04-19 | Disposition: A | Discharge status: Home / Self Care | Payer: No Typology Code available for payment source | Source: Ambulatory Visit | Attending: Urology | Admitting: Urology

## 2023-04-19 ENCOUNTER — Encounter: Payer: Self-pay | Admitting: Urology

## 2023-04-19 VITALS — BP 137/86 | HR 89 | Temp 98.0°F

## 2023-04-19 DIAGNOSIS — N201 Calculus of ureter: Secondary | ICD-10-CM | POA: Diagnosis not present

## 2023-04-19 DIAGNOSIS — N200 Calculus of kidney: Secondary | ICD-10-CM | POA: Diagnosis not present

## 2023-04-19 LAB — URINALYSIS, ROUTINE W REFLEX MICROSCOPIC
Bilirubin, UA: NEGATIVE
Glucose, UA: NEGATIVE
Ketones, UA: NEGATIVE
Nitrite, UA: NEGATIVE
Protein,UA: NEGATIVE
Specific Gravity, UA: 1.02 (ref 1.005–1.030)
Urobilinogen, Ur: 0.2 mg/dL (ref 0.2–1.0)
pH, UA: 6 (ref 5.0–7.5)

## 2023-04-19 LAB — MICROSCOPIC EXAMINATION

## 2023-04-19 NOTE — Progress Notes (Signed)
Name: Anita Jacobson Jacobson DOB: 1991/03/26 MRN: [id number]  History of Present Illness: Anita Jacobson Jacobson is a 33 y.o. female who presents today for follow up visit at [facility] [place]. - GU History: 1. Kidney stone x1 (January 2025).  Recent history: > 03/13/2023:  - Seen in ER for left flank pain. - CT showed a 5 mm stone in the proximal left ureter with no significant hydronephrosis.    > 03/14/2023:  - Returned to ER for uncontrolled left flank pain. - CMP with mildly elevated creatinine (1.37) indicating mild AKI. Treated with IV fluids.  - Pain improved with Toradol, which was prescribed upon discharge.   At initial visit on 04/08/2023: - Reported persistent mild intermittent left flank pain, increased urinary urgency, frequency, and nocturia.  - KUB: "Previously described 5 mm LEFT-sided ureterolithiasis now projects over the expected area of the LEFT UVJ versus bladder."   Today: KUB today: Awaiting radiology read; previous left UVJ stone no longer appreciated per provider interpretation.  She denies flank pain or abdominal pain. She denies fevers, nausea, or vomiting.  She denies increased urinary urgency, frequency, nocturia, dysuria, gross hematuria, hesitancy, straining to void, or sensations of incomplete emptying.   Fall Screening: Do you usually have a device to assist in your mobility? No   Medications: Current Outpatient Medications  Medication Sig Dispense Refill   tamsulosin (FLOMAX) 0.4 MG CAPS capsule Take 1 capsule (0.4 mg total) by mouth daily. 14 capsule 0   No current facility-administered medications for this visit.    Allergies: No Known Allergies  No past medical history on file. Past Surgical History:  Procedure Laterality Date   TONSILLECTOMY     TYMPANOSTOMY TUBE PLACEMENT     No family history on file. Social History   Socioeconomic History   Marital status: Single    Spouse name: Not on file   Number of children: Not on file    Years of education: Not on file   Highest education level: Not on file  Occupational History   Not on file  Tobacco Use   Smoking status: Never   Smokeless tobacco: Never  Vaping Use   Vaping status: Never Used  Substance and Sexual Activity   Alcohol use: Not Currently   Drug use: Yes    Types: Marijuana   Sexual activity: Not on file  Other Topics Concern   Not on file  Social History Narrative   Not on file   Social Drivers of Health   Financial Resource Strain: Not on file  Food Insecurity: Not on file  Transportation Needs: Not on file  Physical Activity: Not on file  Stress: Not on file  Social Connections: Not on file  Intimate Partner Violence: Not on file    SUBJECTIVE  Review of Systems Constitutional: Patient denies any unintentional weight loss or change in strength lntegumentary: Patient denies any rashes or pruritus Cardiovascular: Patient denies chest pain or syncope Respiratory: Patient denies shortness of breath Gastrointestinal: Patient denies nausea, vomiting, constipation, or diarrhea Musculoskeletal: Patient denies muscle cramps or weakness Neurologic: Patient denies convulsions or seizures Allergic/Immunologic: Patient denies recent allergic reaction(s) Hematologic/Lymphatic: Patient denies bleeding tendencies Endocrine: Patient denies heat/cold intolerance  GU: As per HPI.  OBJECTIVE Vitals:   04/19/23 0909  BP: 137/86  Pulse: 89  Temp: 98 F (36.7 C)   There is no height or weight on file to calculate BMI.  Physical Examination Constitutional: No obvious distress; patient is non-toxic appearing  Cardiovascular: No visible  lower extremity edema.  Respiratory: The patient does not have audible wheezing/stridor; respirations do not appear labored  Gastrointestinal: Abdomen non-distended Musculoskeletal: Normal ROM of UEs  Skin: No obvious rashes/open sores  Neurologic: CN 2-12 grossly intact Psychiatric: Answered questions  appropriately with normal affect  Hematologic/Lymphatic/Immunologic: No obvious bruises or sites of spontaneous bleeding  Urine microscopy: 6-10 WBC/hpf, 3-10 RBC/hpf, moderate bacteria   ASSESSMENT Left ureteral stone - Plan: Urinalysis, Routine w reflex microscopic  Kidney stones - Plan: Urinalysis, Routine w reflex microscopic  We reviewed recent imaging results; awaiting radiology results, appears to have no acute findings per provider interpretation.  UA today appears abnormal however patient is asymptomatic for UTI, therefore acute antibiotic treatment is not advised at this time. The rationale for this was discussed with the patient.   For stone prevention: Advised adequate hydration and we discussed option to consider low oxalate diet given that calcium oxalate is the most common type of stone. Handout provided about stone prevention diet.  Will plan to follow up in 6 months with KUB for stone surveillance or sooner if needed. Patient verbalized understanding of and agreement with current plan. All questions were answered.  PLAN Advised the following: Maintain adequate fluid intake daily. Drink citrus juice (lemon, lime or orange juice) routinely. Low oxalate diet. Return in 6 months (on 10/13/2023) for KUB, UA, & f/u with Evette Georges NP , as previously scheduled.  Orders Placed This Encounter  Procedures   Urinalysis, Routine w reflex microscopic   It has been explained that the patient is to follow regularly with their PCP in addition to all other providers involved in their care and to follow instructions provided by these respective offices. Patient advised to contact urology clinic if any urologic-pertaining questions, concerns, new symptoms or problems arise in the interim period.  There are no Patient Instructions on file for this visit.  Electronically signed by:  Donnita Falls, MSN, FNP-C, CUNP 04/19/2023 9:51 AM

## 2023-05-01 DIAGNOSIS — Z419 Encounter for procedure for purposes other than remedying health state, unspecified: Secondary | ICD-10-CM | POA: Diagnosis not present

## 2023-05-05 ENCOUNTER — Ambulatory Visit: Payer: Medicaid Other | Admitting: Physician Assistant

## 2023-05-05 ENCOUNTER — Encounter: Payer: Self-pay | Admitting: Physician Assistant

## 2023-05-05 VITALS — BP 132/84 | HR 88 | Temp 98.8°F | Ht 67.0 in | Wt 237.0 lb

## 2023-05-05 DIAGNOSIS — F909 Attention-deficit hyperactivity disorder, unspecified type: Secondary | ICD-10-CM | POA: Diagnosis not present

## 2023-05-05 DIAGNOSIS — Z8669 Personal history of other diseases of the nervous system and sense organs: Secondary | ICD-10-CM

## 2023-05-05 DIAGNOSIS — Z7689 Persons encountering health services in other specified circumstances: Secondary | ICD-10-CM

## 2023-05-05 DIAGNOSIS — Z79890 Hormone replacement therapy (postmenopausal): Secondary | ICD-10-CM | POA: Diagnosis not present

## 2023-05-05 MED ORDER — NURTEC 75 MG PO TBDP: 75.0000 mg | ORAL_TABLET | ORAL | 1 refills | Status: DC | PRN

## 2023-05-05 NOTE — Progress Notes (Signed)
 New Patient Office Visit  Subjective    Patient ID: Anita Jacobson, female    DOB: 12-26-1990  Age: 33 y.o. MRN: [id number]  CC:  Chief Complaint  Patient presents with   New Patient (Initial Visit)    Gender affirming care     HPI Anita Jacobson presents to establish care  Patient with past medical history significant for ADHD, migraines, juvenile rheumatoid arthritis, and recent kidney stone, presents today to establish care. Patient is not currently on any daily medications and reports no major concerns today. Patient requesting referral to [facility] for gender affirming care with hormone therapy. Patient interested in starting on medication for ADHD again, she states in the past Adderall worked well for her. She described infrequent migraines, but states needs refill on abortive medication. She is overdue for well woman exam.   Outpatient Encounter Medications as of 05/05/2023  Medication Sig   Rimegepant Sulfate (NURTEC) 75 MG TBDP Take 1 tablet (75 mg total) by mouth as needed.   [DISCONTINUED] tamsulosin  (FLOMAX ) 0.4 MG CAPS capsule Take 1 capsule (0.4 mg total) by mouth daily.   No facility-administered encounter medications on file as of 05/05/2023.    Past Medical History:  Diagnosis Date   ADHD    Diabetes mellitus without complication (HCC)     Past Surgical History:  Procedure Laterality Date   TONSILLECTOMY     TONSILLECTOMY     TYMPANOSTOMY TUBE PLACEMENT      Family History  Problem Relation Age of Onset   Lupus Mother    Diabetes Mother    Depression Father    Anxiety disorder Father    Kidney disease Father    Alzheimer's disease Paternal Grandmother     Social History   Socioeconomic History   Marital status: Married    Spouse name: Not on file   Number of children: Not on file   Years of education: Not on file   Highest education level: Not on file  Occupational History   Not on file  Tobacco Use   Smoking status: Never    Smokeless tobacco: Never  Vaping Use   Vaping status: Never Used  Substance and Sexual Activity   Alcohol use: Not Currently   Drug use: Yes    Types: Marijuana   Sexual activity: Not on file  Other Topics Concern   Not on file  Social History Narrative   Not on file   Social Drivers of Health   Financial Resource Strain: Not on file  Food Insecurity: Not on file  Transportation Needs: Not on file  Physical Activity: Not on file  Stress: Not on file  Social Connections: Not on file  Intimate Partner Violence: Not on file    Review of Systems  Constitutional:  Negative for chills, fever and weight loss.  Respiratory:  Negative for cough and shortness of breath.   Cardiovascular:  Negative for chest pain, palpitations and leg swelling.  Gastrointestinal:  Negative for abdominal pain and nausea.  Neurological:  Negative for headaches.        Objective    BP 132/84   Pulse 88   Temp 98.8 F (37.1 C)   Ht 5' 7 (1.702 m)   Wt 237 lb (107.5 kg)   SpO2 96%   BMI 37.12 kg/m   Physical Exam Constitutional:      Appearance: Normal appearance.  HENT:     Head: Normocephalic.     Mouth/Throat:  Mouth: Mucous membranes are moist.     Pharynx: Oropharynx is clear.  Eyes:     Extraocular Movements: Extraocular movements intact.     Conjunctiva/sclera: Conjunctivae normal.  Cardiovascular:     Rate and Rhythm: Normal rate and regular rhythm.     Heart sounds: No murmur heard.    No gallop.  Pulmonary:     Effort: Pulmonary effort is normal.     Breath sounds: No wheezing, rhonchi or rales.  Musculoskeletal:     Right lower leg: No edema.     Left lower leg: No edema.  Skin:    General: Skin is warm and dry.  Neurological:     General: No focal deficit present.     Mental Status: She is alert and oriented to person, place, and time.  Psychiatric:        Mood and Affect: Mood normal.        Behavior: Behavior normal.       Assessment & Plan:   Encounter to establish care  Adult ADHD -     Ambulatory referral to Psychiatry  History of migraine headaches -     Nurtec; Take 1 tablet (75 mg total) by mouth as needed.  Dispense: 30 tablet; Refill: 1  Hormone replacement therapy (HRT) -     Ambulatory referral to Obstetrics / Gynecology  Patient appears well today. Physical exam without any abnormal findings. Referrals for gender affirming care and psychiatry for management of ADHD placed today. Patient will reach out via MyChart or telephone call if referral for gender affirming care was not what was requested by insurance. Nurtec refilled for migraines. Patient scheduled to come back for well women exam including Pap test next week.   Return for well women.   Charmaine Andrian Urbach, PA-C

## 2023-05-07 DIAGNOSIS — F649 Gender identity disorder, unspecified: Secondary | ICD-10-CM | POA: Diagnosis not present

## 2023-05-13 ENCOUNTER — Ambulatory Visit: Payer: Medicaid Other | Admitting: Physician Assistant

## 2023-05-13 ENCOUNTER — Encounter: Payer: Self-pay | Admitting: Physician Assistant

## 2023-05-13 VITALS — BP 139/86 | HR 80 | Temp 97.2°F | Ht 67.0 in | Wt 235.0 lb

## 2023-05-13 DIAGNOSIS — Z8639 Personal history of other endocrine, nutritional and metabolic disease: Secondary | ICD-10-CM

## 2023-05-13 DIAGNOSIS — Z862 Personal history of diseases of the blood and blood-forming organs and certain disorders involving the immune mechanism: Secondary | ICD-10-CM | POA: Diagnosis not present

## 2023-05-13 DIAGNOSIS — Z13220 Encounter for screening for lipoid disorders: Secondary | ICD-10-CM | POA: Diagnosis not present

## 2023-05-13 DIAGNOSIS — Z0120 Encounter for dental examination and cleaning without abnormal findings: Secondary | ICD-10-CM | POA: Diagnosis not present

## 2023-05-13 DIAGNOSIS — Z8269 Family history of other diseases of the musculoskeletal system and connective tissue: Secondary | ICD-10-CM

## 2023-05-13 DIAGNOSIS — Z0000 Encounter for general adult medical examination without abnormal findings: Secondary | ICD-10-CM

## 2023-05-13 DIAGNOSIS — Z1151 Encounter for screening for human papillomavirus (HPV): Secondary | ICD-10-CM

## 2023-05-13 DIAGNOSIS — Z136 Encounter for screening for cardiovascular disorders: Secondary | ICD-10-CM | POA: Diagnosis not present

## 2023-05-13 DIAGNOSIS — Z124 Encounter for screening for malignant neoplasm of cervix: Secondary | ICD-10-CM

## 2023-05-13 DIAGNOSIS — Z1384 Encounter for screening for dental disorders: Secondary | ICD-10-CM | POA: Diagnosis not present

## 2023-05-13 NOTE — Patient Instructions (Signed)
Lab work today, I will message you on MyChart with results  Follow up in one year, unless you need me sooner  It was good to see you, take care!   -Toni Amend

## 2023-05-13 NOTE — Progress Notes (Signed)
Complete physical exam  Patient: Anita Jacobson   DOB: 1991-01-18   33 y.o. Female  MRN: [id number]  Subjective:    Chief Complaint  Patient presents with   Annual Exam    W/ pap    Anita Jacobson is a 33 y.o. female who presents today for a complete physical exam. She reports consuming a general diet.  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  She generally feels well. She reports sleeping, wakes up frequently, does not want sleep medication. She does not have additional problems to discuss today.    Most recent fall risk assessment:    05/05/2023    1:42 PM  Fall Risk   Falls in the past year? 0  Injury with Fall? 0     Most recent depression screenings:    05/13/2023   11:43 AM 05/05/2023    1:42 PM  PHQ 2/9 Scores  PHQ - 2 Score 0 0  PHQ- 9 Score 2 3    Vision:Within last year and Dental: Receives regular dental care  Patient Care Team: Girlie Veltri, Toni Amend, [place] as PCP - General (Physician Assistant)   Outpatient Medications Prior to Visit  Medication Sig   Rimegepant Sulfate (NURTEC) 75 MG TBDP Take 1 tablet (75 mg total) by mouth as needed.   Testosterone 20.25 MG/ACT (1.62%) GEL APPLY 2 PUMPS ONTO THE SKIN TOPICALLY ONCE DAILY   No facility-administered medications prior to visit.    Review of Systems  Constitutional:  Negative for chills, fever, malaise/fatigue and weight loss.  HENT:  Negative for congestion, ear discharge, ear pain and sore throat.   Eyes:  Negative for double vision, discharge and redness.  Respiratory:  Negative for cough, shortness of breath and wheezing.   Cardiovascular:  Negative for chest pain and palpitations.  Gastrointestinal:  Negative for abdominal pain, constipation, diarrhea, nausea and vomiting.  Genitourinary:  Negative for frequency and urgency.  Musculoskeletal:  Positive for joint pain. Negative for back pain and myalgias.  Neurological:  Negative for dizziness, tremors, seizures, weakness and  headaches.  Psychiatric/Behavioral:  Negative for depression. The patient is not nervous/anxious.         Objective:     BP 139/86   Pulse 80   Temp (!) 97.2 F (36.2 C)   Ht 5\' 7"  (1.702 m)   Wt 235 lb (106.6 kg)   SpO2 97%   BMI 36.81 kg/m  BP Readings from Last 3 Encounters:  05/13/23 139/86  05/05/23 132/84  04/19/23 137/86   Wt Readings from Last 3 Encounters:  05/13/23 235 lb (106.6 kg)  05/05/23 237 lb (107.5 kg)  03/14/23 250 lb (113.4 kg)      Physical Exam Chaperone present: chaperone deferred, wife present at bedside.  Constitutional:      Appearance: Normal appearance.  HENT:     Head: Normocephalic.     Right Ear: Tympanic membrane normal.     Left Ear: Tympanic membrane normal.     Nose: Nose normal. No congestion or rhinorrhea.     Mouth/Throat:     Mouth: Mucous membranes are moist.     Pharynx: Oropharynx is clear.  Eyes:     Extraocular Movements: Extraocular movements intact.     Conjunctiva/sclera: Conjunctivae normal.  Cardiovascular:     Rate and Rhythm: Normal rate and regular rhythm.     Heart sounds: No murmur heard.    No gallop.  Pulmonary:     Effort: Pulmonary effort  is normal.     Breath sounds: Normal breath sounds. No wheezing, rhonchi or rales.  Abdominal:     General: Abdomen is flat. Bowel sounds are normal.     Palpations: Abdomen is soft.     Tenderness: There is no abdominal tenderness.  Genitourinary:    General: Normal vulva.     Pubic Area: No rash.      Vagina: No erythema or lesions.     Cervix: Discharge present. No cervical motion tenderness or erythema.     Adnexa:        Right: No tenderness.         Left: No tenderness.    Musculoskeletal:     Right lower leg: No edema.     Left lower leg: No edema.  Skin:    General: Skin is warm and dry.     Capillary Refill: Capillary refill takes less than 2 seconds.  Neurological:     General: No focal deficit present.     Mental Status: She is alert and  oriented to person, place, and time.  Psychiatric:        Mood and Affect: Mood normal.        Behavior: Behavior normal.      No results found for any visits on 05/13/23.     Assessment & Plan:    Routine Health Maintenance and Physical Exam  Health Maintenance  Topic Date Due   HIV Screening  Never done   Hepatitis C Screening  Never done   Pap with HPV screening  Never done   COVID-19 Vaccine (1) 05/21/2023*   Flu Shot  06/28/2023*   DTaP/Tdap/Td vaccine (2 - Td or Tdap) 03/30/2025   HPV Vaccine  Aged Out  *Topic was postponed. The date shown is not the original due date.    Discussed health benefits of physical activity, and encouraged her to engage in regular exercise appropriate for her age and condition.  Problem List Items Addressed This Visit   None Visit Diagnoses       Encounter for annual general medical examination without abnormal findings in adult    -  Primary     Screening for cervical cancer       Relevant Orders   IGP, Aptima HPV     Screening for human papillomavirus (HPV)       Relevant Orders   IGP, Aptima HPV     History of elevated glucose       Relevant Orders   CMP14+EGFR   Hemoglobin A1c     History of lymphocytosis       Relevant Orders   CBC with Differential     Encounter for lipid screening for cardiovascular disease       Relevant Orders   CMP14+EGFR   Lipid Panel     Family history of systemic lupus erythematosus (SLE) in mother       Relevant Orders   ANA      Return in about 1 year (around 05/12/2024).   Adult wellness-complete.wellness physical was conducted today. Importance of diet and exercise were discussed in detail.  Importance of stress reduction and healthy living were discussed.  In addition to this a discussion regarding safety was also covered.  We also reviewed over immunizations and gave recommendations regarding current immunization needed for age.   In addition to this additional areas were also touched  on including: none, no other patient concerns today  Preventative health exams needed: Pap done  today, defers HIV and Hep C screening at this time  Colonoscopy not indicated  Patient was advised yearly wellness exam   Toni Amend Martavis Gurney, PA-C

## 2023-05-14 ENCOUNTER — Encounter: Payer: Self-pay | Admitting: Physician Assistant

## 2023-05-14 ENCOUNTER — Telehealth: Payer: Self-pay | Admitting: *Deleted

## 2023-05-14 LAB — CBC WITH DIFFERENTIAL/PLATELET
Basophils Absolute: 0.1 10*3/uL (ref 0.0–0.2)
Basos: 1 %
EOS (ABSOLUTE): 0.2 10*3/uL (ref 0.0–0.4)
Eos: 2 %
Hematocrit: 40 % (ref 34.0–46.6)
Hemoglobin: 12.3 g/dL (ref 11.1–15.9)
Immature Grans (Abs): 0 10*3/uL (ref 0.0–0.1)
Immature Granulocytes: 0 %
Lymphocytes Absolute: 2.8 10*3/uL (ref 0.7–3.1)
Lymphs: 27 %
MCH: 24.7 pg — ABNORMAL LOW (ref 26.6–33.0)
MCHC: 30.8 g/dL — ABNORMAL LOW (ref 31.5–35.7)
MCV: 80 fL (ref 79–97)
Monocytes Absolute: 0.6 10*3/uL (ref 0.1–0.9)
Monocytes: 6 %
Neutrophils Absolute: 6.4 10*3/uL (ref 1.4–7.0)
Neutrophils: 64 %
Platelets: 340 10*3/uL (ref 150–450)
RBC: 4.98 x10E6/uL (ref 3.77–5.28)
RDW: 15.8 % — ABNORMAL HIGH (ref 11.7–15.4)
WBC: 10.1 10*3/uL (ref 3.4–10.8)

## 2023-05-14 LAB — LIPID PANEL
Chol/HDL Ratio: 4.9 {ratio} — ABNORMAL HIGH (ref 0.0–4.4)
Cholesterol, Total: 138 mg/dL (ref 100–199)
HDL: 28 mg/dL — ABNORMAL LOW (ref >39)
LDL Chol Calc (NIH): 67 mg/dL (ref 0–99)
Triglycerides: 269 mg/dL — ABNORMAL HIGH (ref 0–149)
VLDL Cholesterol Cal: 43 mg/dL — ABNORMAL HIGH (ref 5–40)

## 2023-05-14 LAB — CMP14+EGFR
ALT: 11 [IU]/L (ref 0–32)
AST: 14 [IU]/L (ref 0–40)
Albumin: 4.3 g/dL (ref 3.9–4.9)
Alkaline Phosphatase: 68 [IU]/L (ref 44–121)
BUN/Creatinine Ratio: 8 — ABNORMAL LOW (ref 9–23)
BUN: 8 mg/dL (ref 6–20)
Bilirubin Total: 0.3 mg/dL (ref 0.0–1.2)
CO2: 24 mmol/L (ref 20–29)
Calcium: 8.9 mg/dL (ref 8.7–10.2)
Chloride: 107 mmol/L — ABNORMAL HIGH (ref 96–106)
Creatinine, Ser: 0.97 mg/dL (ref 0.57–1.00)
Globulin, Total: 2.4 g/dL (ref 1.5–4.5)
Glucose: 96 mg/dL (ref 70–99)
Potassium: 3.9 mmol/L (ref 3.5–5.2)
Sodium: 144 mmol/L (ref 134–144)
Total Protein: 6.7 g/dL (ref 6.0–8.5)
eGFR: 79 mL/min/{1.73_m2} (ref >59)

## 2023-05-14 LAB — HEMOGLOBIN A1C
Est. average glucose Bld gHb Est-mCnc: 108 mg/dL
Hgb A1c MFr Bld: 5.4 % (ref 4.8–5.6)

## 2023-05-14 LAB — ANA: Anti Nuclear Antibody (ANA): POSITIVE — AB

## 2023-05-14 NOTE — Telephone Encounter (Signed)
Grooms, [place], [place]    I believe she told me yesterday she was able to pick this medication up from the pharmacy after our visit last week.

## 2023-05-14 NOTE — Telephone Encounter (Signed)
Pharmacy stated patient has not been able to pick up med  due to cost- needing PA for coverage

## 2023-05-14 NOTE — Telephone Encounter (Signed)
Attempted PA for Nurtec- Per patient's insurance (welcare medicaid) medication is not formulary and must try and fail the 2 formulary alternatives   Formulary alternatives: Rizatriptan and Sumatriptan

## 2023-05-17 ENCOUNTER — Encounter: Payer: Self-pay | Admitting: Physician Assistant

## 2023-05-19 LAB — IGP, APTIMA HPV: HPV Aptima: NEGATIVE

## 2023-05-19 LAB — SPECIMEN STATUS REPORT

## 2023-05-20 ENCOUNTER — Encounter: Payer: Self-pay | Admitting: Physician Assistant

## 2023-05-29 DIAGNOSIS — Z419 Encounter for procedure for purposes other than remedying health state, unspecified: Secondary | ICD-10-CM | POA: Diagnosis not present

## 2023-07-08 ENCOUNTER — Encounter (HOSPITAL_COMMUNITY): Payer: Self-pay

## 2023-07-08 ENCOUNTER — Ambulatory Visit (HOSPITAL_COMMUNITY): Admitting: Registered Nurse

## 2023-07-10 DIAGNOSIS — Z419 Encounter for procedure for purposes other than remedying health state, unspecified: Secondary | ICD-10-CM | POA: Diagnosis not present

## 2023-07-21 ENCOUNTER — Ambulatory Visit: Admitting: Physician Assistant

## 2023-07-21 ENCOUNTER — Encounter: Payer: Self-pay | Admitting: Physician Assistant

## 2023-07-21 VITALS — BP 138/85 | HR 90 | Temp 98.6°F | Ht 67.0 in | Wt 245.0 lb

## 2023-07-21 DIAGNOSIS — G43919 Migraine, unspecified, intractable, without status migrainosus: Secondary | ICD-10-CM | POA: Diagnosis not present

## 2023-07-21 DIAGNOSIS — G43909 Migraine, unspecified, not intractable, without status migrainosus: Secondary | ICD-10-CM | POA: Insufficient documentation

## 2023-07-21 NOTE — Progress Notes (Signed)
   Acute Office Visit  Subjective:     Patient ID: Anita Jacobson, female    DOB: [birth year], 33 y.o.   MRN: [id number]   Patient presents today with accomodation forms for work. She is requesting forms filled out to allow her to wear sound dampening ear plugs to work to avoid migraines. She reports wearing Loop ear plugs to work daily to decrease work related noise.     Review of Systems  All other systems reviewed and are negative.       Objective:     BP 138/85   Pulse 90   Temp 98.6 F (37 C)   Ht 5\' 7"  (1.702 m)   Wt 245 lb (111.1 kg)   SpO2 98%   BMI 38.37 kg/m   Physical Exam Constitutional:      Appearance: Normal appearance.  HENT:     Head: Normocephalic and atraumatic.  Cardiovascular:     Rate and Rhythm: Normal rate and regular rhythm.  Pulmonary:     Effort: Pulmonary effort is normal.     Breath sounds: Normal breath sounds.  Neurological:     Mental Status: She is alert.     No results found for any visits on 07/21/23.      Assessment & Plan:  Intractable migraine without status migrainosus, unspecified migraine type Assessment & Plan: Patient presents today with forms for work accomodation. Forms filled out recommending patient be allowed to wear sound dampening ear plugs at work to decrease potential for migraines. Patient to contact clinic is she is to need more documentation for accomodation.     No follow-ups on file.  Anita Mince Hildur Bayer, PA-C

## 2023-07-21 NOTE — Assessment & Plan Note (Signed)
 Patient presents today with forms for work accomodation. Forms filled out recommending patient be allowed to wear sound dampening ear plugs at work to decrease potential for migraines. Patient to contact clinic is she is to need more documentation for accomodation.

## 2023-07-30 DIAGNOSIS — F649 Gender identity disorder, unspecified: Secondary | ICD-10-CM | POA: Diagnosis not present

## 2023-08-09 DIAGNOSIS — Z419 Encounter for procedure for purposes other than remedying health state, unspecified: Secondary | ICD-10-CM | POA: Diagnosis not present

## 2023-08-18 ENCOUNTER — Encounter: Payer: Self-pay | Admitting: Physician Assistant

## 2023-08-18 ENCOUNTER — Ambulatory Visit (INDEPENDENT_AMBULATORY_CARE_PROVIDER_SITE_OTHER): Admitting: Physician Assistant

## 2023-08-18 VITALS — BP 124/79 | HR 88 | Temp 97.7°F | Ht 67.0 in | Wt 243.0 lb

## 2023-08-18 DIAGNOSIS — J0110 Acute frontal sinusitis, unspecified: Secondary | ICD-10-CM | POA: Diagnosis not present

## 2023-08-18 MED ORDER — AMOXICILLIN-POT CLAVULANATE 875-125 MG PO TABS: 1.0000 | ORAL_TABLET | Freq: Two times a day (BID) | ORAL | 0 refills | Status: AC

## 2023-08-18 MED ORDER — FLUCONAZOLE 150 MG PO TABS: 150.0000 mg | ORAL_TABLET | Freq: Once | ORAL | 0 refills | Status: AC

## 2023-08-18 NOTE — Assessment & Plan Note (Signed)
 Presentation was consistent with sinusitis.  No evidence of other bacterial infections including pneumonia, pharyngitis, otitis media, or orbital cellulitis. Discussed that this fits the picture of viral vs bacterial sinusitis and that due to type and duration of symptoms and exam findings, we will treat as bacterial sinusitis.  Antibiotics prescribed. Advised to continue ibuprofen  and Tylenol  at home. The patient was instructed to return if the worsens in any way, especially if not tolerating fluids, increased sinus pain or swelling, worsening headache, persistent fever, difficulty swallowing or breathing, or as needed. The patient agreed with the plan.

## 2023-08-18 NOTE — Progress Notes (Signed)
 Acute Office Visit  Subjective:     Patient ID: RASHAN PATIENT, female    DOB: 12-13-1990, 33 y.o.   MRN: [id number]   Patient complains of nasal congestion, post nasal drip, and sinus pressure. Symptoms include facial pain, headache described as band like around forehead, no  fever, and non productive cough with no fever, chills, night sweats or weight loss. Onset of symptoms was 5 days ago, gradually worsening since that time. She is drinking plenty of fluids.  Past history is significant for no history of pneumonia or bronchitis. Patient is non-smoker. 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      Review of Systems  Constitutional:  Positive for malaise/fatigue. Negative for fever.  HENT:  Positive for congestion, ear pain, sinus pain and sore throat.   Respiratory:  Positive for cough and sputum production. Negative for shortness of breath.   Cardiovascular:  Negative for chest pain and palpitations.  Neurological:  Positive for headaches.       Objective:     BP 124/79   Pulse 88   Temp 97.7 F (36.5 C)   Ht 5\' 7"  (1.702 m)   Wt 243 lb (110.2 kg)   SpO2 96%   BMI 38.06 kg/m   Physical Exam Vitals reviewed.  Constitutional:      General: She is not in acute distress.    Appearance: Normal appearance.  HENT:     Right Ear: Tympanic membrane normal.     Left Ear: Tympanic membrane normal.     Nose: Congestion and rhinorrhea present.     Right Sinus: Frontal sinus tenderness present.     Left Sinus: Frontal sinus tenderness present.     Mouth/Throat:     Mouth: Mucous membranes are moist.     Pharynx: Oropharynx is clear.  Eyes:     Extraocular Movements: Extraocular movements intact.     Conjunctiva/sclera: Conjunctivae normal.  Cardiovascular:     Rate and Rhythm: Normal rate and regular rhythm.     Heart sounds: No murmur heard. Pulmonary:     Effort: Pulmonary effort is normal.     Breath sounds: Normal breath sounds.  Musculoskeletal:        General: Normal range of  motion.  Skin:    General: Skin is warm and dry.     Capillary Refill: Capillary refill takes less than 2 seconds.  Neurological:     General: No focal deficit present.     Mental Status: She is alert and oriented to person, place, and time.  Psychiatric:        Mood and Affect: Mood normal.        Behavior: Behavior normal.     No results found for any visits on 08/18/23.      Assessment & Plan:  Acute non-recurrent frontal sinusitis Assessment & Plan: Presentation was consistent with sinusitis.  No evidence of other bacterial infections including pneumonia, pharyngitis, otitis media, or orbital cellulitis. Discussed that this fits the picture of viral vs bacterial sinusitis and that due to type and duration of symptoms and exam findings, we will treat as bacterial sinusitis.  Antibiotics prescribed. Advised to continue ibuprofen  and Tylenol  at home. The patient was instructed to return if the worsens in any way, especially if not tolerating fluids, increased sinus pain or swelling, worsening headache, persistent fever, difficulty swallowing or breathing, or as needed. The patient agreed with the plan.    Orders: -     Amoxicillin-Pot Clavulanate; Take 1 tablet  by mouth 2 (two) times daily for 7 days.  Dispense: 14 tablet; Refill: 0  Other orders -     Fluconazole ; Take 1 tablet (150 mg total) by mouth once for 1 dose.  Dispense: 1 tablet; Refill: 0   Return if symptoms worsen or fail to improve.  Jearlean Mince Raivyn Kabler, PA-C

## 2023-09-09 DIAGNOSIS — Z419 Encounter for procedure for purposes other than remedying health state, unspecified: Secondary | ICD-10-CM | POA: Diagnosis not present

## 2023-10-09 DIAGNOSIS — Z419 Encounter for procedure for purposes other than remedying health state, unspecified: Secondary | ICD-10-CM | POA: Diagnosis not present

## 2023-10-12 ENCOUNTER — Telehealth: Payer: Self-pay

## 2023-10-12 NOTE — Telephone Encounter (Signed)
-----   Message from Lauraine JAYSON Oz sent at 10/12/2023  1:40 PM EDT ----- Please instruct patient to get KUB prior to visit tomorrow. Thanks.

## 2023-10-12 NOTE — Telephone Encounter (Signed)
 Pt was made aware and voiced that she need to cancel appt due to not being able to get off of work.

## 2023-10-12 NOTE — Progress Notes (Deleted)
 Name: Anita Jacobson DOB: Jul 27, 1990 MRN: [id number]  History of Present Illness: Anita Jacobson is a 33 y.o. female who presents today for follow up visit at [facility] [place]. Relevant History includes: 1. Kidney stones.  At last visit on 04/19/2023: Doing well. KUB showed a subcentimeter left inferior pole stone.  Since last visit: ***  Today: KUB today: Awaiting radiology read; *** appreciated per provider interpretation.  She {Actions; denies-reports:120008} recent suspected stone migration / passage. She {Actions; denies-reports:120008} flank pain or abdominal pain. She {Actions; denies-reports:120008} fevers, nausea, or vomiting.  She {Actions; denies-reports:120008} increased urinary urgency, frequency, nocturia, dysuria, gross hematuria, hesitancy, straining to void, or sensations of incomplete emptying.  Medications: Current Outpatient Medications  Medication Sig Dispense Refill   Rimegepant Sulfate (NURTEC) 75 MG TBDP Take 1 tablet (75 mg total) by mouth as needed. 30 tablet 1   Testosterone 20.25 MG/ACT (1.62%) GEL APPLY 2 PUMPS ONTO THE SKIN TOPICALLY ONCE DAILY     No current facility-administered medications for this visit.    Allergies: No Known Allergies  Past Medical History:  Diagnosis Date   ADHD    Diabetes mellitus without complication (HCC)    Past Surgical History:  Procedure Laterality Date   TONSILLECTOMY     TONSILLECTOMY     TYMPANOSTOMY TUBE PLACEMENT     Family History  Problem Relation Age of Onset   Lupus Mother    Diabetes Mother    Depression Father    Anxiety disorder Father    Kidney disease Father    Alzheimer's disease Paternal Grandmother    Social History   Socioeconomic History   Marital status: Married    Spouse name: Not on file   Number of children: Not on file   Years of education: Not on file   Highest education level: Not on file  Occupational History   Not on file  Tobacco Use   Smoking status:  Never   Smokeless tobacco: Never  Vaping Use   Vaping status: Never Used  Substance and Sexual Activity   Alcohol use: Not Currently   Drug use: Yes    Types: Marijuana   Sexual activity: Not on file  Other Topics Concern   Not on file  Social History Narrative   Not on file   Social Drivers of Health   Financial Resource Strain: Not on file  Food Insecurity: Not on file  Transportation Needs: Not on file  Physical Activity: Not on file  Stress: Not on file  Social Connections: Not on file  Intimate Partner Violence: Not on file    SUBJECTIVE  Review of Systems Constitutional: Patient denies any unintentional weight loss or change in strength lntegumentary: Patient denies any rashes or pruritus Cardiovascular: Patient denies chest pain or syncope Respiratory: Patient denies shortness of breath Gastrointestinal: ***Patient denies nausea, vomiting, constipation, or diarrhea ***As per HPI Musculoskeletal: Patient denies muscle cramps or weakness Neurologic: Patient denies convulsions or seizures Allergic/Immunologic: Patient denies recent allergic reaction(s) Hematologic/Lymphatic: Patient denies bleeding tendencies Endocrine: Patient denies heat/cold intolerance  GU: As per HPI.  OBJECTIVE There were no vitals filed for this visit. There is no height or weight on file to calculate BMI.  Physical Examination Constitutional: No obvious distress; patient is non-toxic appearing  Cardiovascular: No visible lower extremity edema.  Respiratory: The patient does not have audible wheezing/stridor; respirations do not appear labored  Gastrointestinal: Abdomen non-distended Musculoskeletal: Normal ROM of UEs  Skin: No obvious rashes/open sores  Neurologic: CN 2-12  grossly intact Psychiatric: Answered questions appropriately with normal affect  Hematologic/Lymphatic/Immunologic: No obvious bruises or sites of spontaneous bleeding  UA: ***negative ***positive for ***  leukocytes, *** blood, ***nitrites Urine microscopy: *** WBC/hpf, *** RBC/hpf, *** bacteria ***glucosuria (secondary to ***Jardiance ***Farxiga use) ***otherwise unremarkable  PVR: *** ml  ASSESSMENT No diagnosis found.  ***We reviewed recent imaging results; ***awaiting radiology results, appears to have ***no acute findings per provider interpretation.  ***For stone prevention: Advised adequate hydration and we discussed option to consider low oxalate diet given that calcium oxalate is the most common type of stone. Handout provided about stone prevention diet.  ***For recurrent stone formers: We discussed option to proceed with 24 hour urinalysis ([facility]) for metabolic stone evaluation, which may help with targeted recommendations for dietary I medication therapies for stone prevention. Patient elected to ***proceed/ ***hold off.  Will plan to follow up in ***6 months / ***1 year with ***KUB ***RUS for stone surveillance or sooner if needed.  Patient verbalized understanding of and agreement with current plan. All questions were answered.  PLAN Advised the following: Maintain adequate fluid intake daily. Drink citrus juice (lemon, lime or orange juice) routinely. Low oxalate diet. No follow-ups on file.  No orders of the defined types were placed in this encounter.   It has been explained that the patient is to follow regularly with their PCP in addition to all other providers involved in their care and to follow instructions provided by these respective offices. Patient advised to contact urology clinic if any urologic-pertaining questions, concerns, new symptoms or problems arise in the interim period.  There are no Patient Instructions on file for this visit.  Electronically signed by:  Lauraine KYM Oz, MSN, FNP-C, CUNP 10/12/2023 1:38 PM

## 2023-10-13 ENCOUNTER — Ambulatory Visit: Payer: Medicaid Other | Admitting: Urology

## 2023-10-18 ENCOUNTER — Ambulatory Visit: Admitting: Physician Assistant

## 2023-10-18 ENCOUNTER — Encounter: Payer: Self-pay | Admitting: Physician Assistant

## 2023-10-18 VITALS — BP 134/85 | HR 81 | Temp 98.6°F | Ht 67.0 in | Wt 242.0 lb

## 2023-10-18 DIAGNOSIS — J020 Streptococcal pharyngitis: Secondary | ICD-10-CM | POA: Insufficient documentation

## 2023-10-18 DIAGNOSIS — K1370 Unspecified lesions of oral mucosa: Secondary | ICD-10-CM | POA: Diagnosis not present

## 2023-10-18 LAB — POCT RAPID STREP A (OFFICE): Rapid Strep A Screen: POSITIVE — AB

## 2023-10-18 MED ORDER — AMOXICILLIN 500 MG PO CAPS: 500.0000 mg | ORAL_CAPSULE | Freq: Two times a day (BID) | ORAL | 0 refills | Status: AC

## 2023-10-18 MED ORDER — LIDOCAINE VISCOUS HCL 2 % MT SOLN: 5.0000 mL | Freq: Three times a day (TID) | OROMUCOSAL | 0 refills | Status: DC

## 2023-10-18 NOTE — Addendum Note (Signed)
 Addended by: Abdulaziz Toman R on: 10/18/2023 09:48 AM   Modules accepted: Orders

## 2023-10-18 NOTE — Assessment & Plan Note (Signed)
 Positive rapid strep: Antibiotics prescribed.  Promote hydration.  Analgesia and fever control with acetaminophen , ibuprofen . Follow up if worsening, fevers persist for > 5 days, severe pain with swallowing or unable to swallow. Magic mouthwash prescribed for oral lesions. High index of suspiciousness for hand, foot, and mouth co-infection. Patient to follow up for new headaches, chest pain, abdominal pain, neck stiffness, or worsening symptoms.  Work noted provided today.

## 2023-10-18 NOTE — Progress Notes (Addendum)
 Acute Office Visit  Subjective:     Patient ID: Anita Jacobson, female    DOB: September 04, 1990, 33 y.o.   MRN: [id number]   Patient presents today with concerns for hand, foot, mouth, infection. 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 She relates starting Saturday evening/Sunday morning she began feeling fatigued and noticed blisters and sores in her mouth, specifically on her lips and gums. She denies sores on her hands or feet. Admits decreased appetite and sore throat. Denies fevers, headache, or chest pain. Has been staying well hydrated.      Review of Systems  Constitutional:  Positive for malaise/fatigue. Negative for fever.  HENT:  Positive for sore throat. Negative for congestion and ear pain.   Respiratory:  Negative for cough.   Cardiovascular:  Negative for chest pain.  Gastrointestinal:  Negative for abdominal pain, nausea and vomiting.  Musculoskeletal:  Negative for myalgias.  Neurological:  Negative for headaches.        Objective:     BP 134/85   Pulse 81   Temp 98.6 F (37 C)   Ht 5' 7 (1.702 m)   Wt 242 lb (109.8 kg)   SpO2 98%   BMI 37.90 kg/m   Physical Exam Vitals reviewed.  Constitutional:      General: She is not in acute distress.    Appearance: Normal appearance. She is obese.  HENT:     Right Ear: Tympanic membrane normal.     Left Ear: Tympanic membrane normal.     Nose: Nose normal.     Mouth/Throat:     Lips: Lesions present.     Mouth: Mucous membranes are moist.     Pharynx: Oropharynx is clear. Posterior oropharyngeal erythema present.  Eyes:     Extraocular Movements: Extraocular movements intact.     Conjunctiva/sclera: Conjunctivae normal.  Cardiovascular:     Rate and Rhythm: Normal rate and regular rhythm.     Heart sounds: Normal heart sounds. No murmur heard.    No friction rub.  Pulmonary:     Effort: Pulmonary effort is normal.     Breath sounds: Normal breath sounds. No  wheezing or rhonchi.  Lymphadenopathy:     Cervical: No cervical adenopathy.  Skin:    General: Skin is warm and dry.  Neurological:     General: No focal deficit present.     Mental Status: She is alert and oriented to person, place, and time.  Psychiatric:        Mood and Affect: Mood normal.        Behavior: Behavior normal.     Results for orders placed or performed in visit on 10/18/23  POCT rapid strep A  Result Value Ref Range   Rapid Strep A Screen Positive (A) Negative        Assessment & Plan:  Acute streptococcal pharyngitis Assessment & Plan: Positive rapid strep: Antibiotics prescribed.  Promote hydration.  Analgesia and fever control with acetaminophen , ibuprofen . Follow up if worsening, fevers persist for > 5 days, severe pain with swallowing or unable to swallow. Magic mouthwash prescribed for oral lesions. High index of suspiciousness for hand, foot, and mouth co-infection. Patient to follow up for new headaches, chest pain, abdominal pain, neck stiffness, or worsening symptoms.  Work noted provided today.    Orders: -     Amoxicillin ; Take 1 capsule (500 mg total) by mouth 2 (two) times daily for 10 days.  Dispense: 20 capsule; Refill: 0 -     POCT rapid strep A  Unspecified lesions of oral mucosa -     magic mouthwash (lidocaine , diphenhydrAMINE, alum & mag hydroxide) suspension; Swish and spit 5 mLs 3 (three) times daily.  Dispense: 360 mL; Refill: 0 -     POCT rapid strep A     No follow-ups on file.  Charmaine Buel Molder, PA-C

## 2023-11-09 DIAGNOSIS — Z419 Encounter for procedure for purposes other than remedying health state, unspecified: Secondary | ICD-10-CM | POA: Diagnosis not present

## 2023-12-03 DIAGNOSIS — F649 Gender identity disorder, unspecified: Secondary | ICD-10-CM | POA: Diagnosis not present

## 2023-12-10 DIAGNOSIS — Z419 Encounter for procedure for purposes other than remedying health state, unspecified: Secondary | ICD-10-CM | POA: Diagnosis not present

## 2024-01-03 ENCOUNTER — Ambulatory Visit: Payer: Self-pay

## 2024-01-03 ENCOUNTER — Ambulatory Visit
Admission: RE | Admit: 2024-01-03 | Discharge: 2024-01-03 | Disposition: A | Discharge status: Home / Self Care | Source: Ambulatory Visit | Attending: Nurse Practitioner | Admitting: Nurse Practitioner

## 2024-01-03 VITALS — BP 110/66 | HR 82 | Temp 98.2°F | Resp 16

## 2024-01-03 DIAGNOSIS — M5442 Lumbago with sciatica, left side: Secondary | ICD-10-CM

## 2024-01-03 DIAGNOSIS — Z8669 Personal history of other diseases of the nervous system and sense organs: Secondary | ICD-10-CM | POA: Diagnosis not present

## 2024-01-03 MED ORDER — TIZANIDINE HCL 4 MG PO TABS: 4.0000 mg | ORAL_TABLET | Freq: Three times a day (TID) | ORAL | 0 refills | Status: DC | PRN

## 2024-01-03 MED ORDER — PREDNISONE 20 MG PO TABS
40.0000 mg | ORAL_TABLET | Freq: Every day | ORAL | 0 refills | Status: AC
Start: 2024-01-03 — End: 2024-01-08

## 2024-01-03 MED ORDER — KETOROLAC TROMETHAMINE 30 MG/ML IJ SOLN
30.0000 mg | Freq: Once | INTRAMUSCULAR | Status: AC
  Administered 2024-01-03: 30 mg via INTRAMUSCULAR

## 2024-01-03 MED ORDER — DEXAMETHASONE SODIUM PHOSPHATE 10 MG/ML IJ SOLN
10.0000 mg | INTRAMUSCULAR | Status: AC
  Administered 2024-01-03: 10 mg via INTRAMUSCULAR

## 2024-01-03 NOTE — Discharge Instructions (Addendum)
 You were given injections of Decadron 10 mg and Toradol  30 mg. Start the prednisone tomorrow.  Do not take any additional NSAIDs today to include ibuprofen , Aleve , Motrin , Advil , or naproxen . Recommend taking over-the-counter Tylenol  for breakthrough pain or discomfort. Take medication as prescribed.  Continue to take Tylenol  while you are taking the prednisone. Try to remain as active as possible. Gentle range of motion and stretching exercises to help with back spasm and pain. May apply ice or heat as needed.  Ice is recommended for pain or swelling, heat for spasm or stiffness.  Apply for 20 minutes, remove for 1 hour, then repeat. Go to the emergency department immediately if you develop weakness in your legs or feet, inability to walk, loss of bowel or bladder function, difficulty urinating or passing a bowel movement, or other concerns. If symptoms fail to improve with treatment, recommend follow-up with your primary care physician or orthopedics for further evaluation. Follow-up as needed. Follow-up with your primary care physician if your symptoms do not improve.

## 2024-01-03 NOTE — Telephone Encounter (Signed)
 FYI Only or Action Required?: Action required by provider: request for appointment.  Patient was last seen in primary care on 10/18/2023 by Grooms, [place], [place].  Called Nurse Triage reporting Back Pain.  Symptoms began several days ago.  Interventions attempted: OTC medications: aleve .  Symptoms are: unchanged.  Triage Disposition: See PCP When Office is Open (Within 3 Days)  Patient/caregiver understands and will follow disposition?: Yes

## 2024-01-03 NOTE — Telephone Encounter (Signed)
 Lower back muscle pain, hard to walk, interested in an appointment today, could not hold for Nurse Triage because she was at work.   Reason for Disposition . [1] MODERATE back pain (e.g., interferes with normal activities) AND [2] present > 3 days  Answer Assessment - Initial Assessment Questions Pt was working in yard yesterday and feels she strained/pulled muscle. Sitting is dull ache but moving intensifies greatly.      1. ONSET: When did the pain begin? (e.g., minutes, hours, days)     Yesterday  2. LOCATION: Where does it hurt? (upper, mid or lower back)     Lower back  3. SEVERITY: How bad is the pain?  (e.g., Scale 1-10; mild, moderate, or severe)     Mild to severe 4. PATTERN: Is the pain constant? (e.g., yes, no; constant, intermittent)      Comes and goes 5. RADIATION: Does the pain shoot into your legs or somewhere else?     Down into legs 6. CAUSE:  What do you think is causing the back pain?      Yard work  7. BACK OVERUSE:  Any recent lifting of heavy objects, strenuous work or exercise?     Working in yard 8. MEDICINES: What have you taken so far for the pain? (e.g., nothing, acetaminophen , NSAIDS)     Aleve , muscle rub 9. NEUROLOGIC SYMPTOMS: Do you have any weakness, numbness, or problems with bowel/bladder control?     denies 10. OTHER SYMPTOMS: Do you have any other symptoms? (e.g., fever, abdomen pain, burning with urination, blood in urine)       denies  Protocols used: Back Pain-A-AH

## 2024-01-03 NOTE — Telephone Encounter (Signed)
 Appointment scheduled.

## 2024-01-03 NOTE — ED Provider Notes (Signed)
 [facility]    CSN: [id number] Arrival date & time: 01/03/24  1427      History   Chief Complaint Chief Complaint  Patient presents with   Back Pain    Pulled muscle in back doing yard work - Entered by patient    HPI Anita Jacobson is a 33 y.o. female.   The history is provided by the patient.   Patient presents for complaints of low back pain.  Patient states that she started having pain after doing yard work over the weekend.  States that she felt fine, but when she stood up, she developed pain in her lower back.  She states that the pain is radiating down the left leg.  She denies numbness, tingling, loss of bowel or bladder, lower extremity weakness, injury, or trauma.  She reports prior history of sciatica, states I think I flared up my sciatica.  So far, she has tried Epsom salt bath, massage, over-the-counter muscle rub, and stretching with minimal relief.  Past Medical History:  Diagnosis Date   ADHD    Diabetes mellitus without complication [facility])     Patient Active Problem List   Diagnosis Date Noted   Acute streptococcal pharyngitis 10/18/2023   Migraines 07/21/2023   Hallux valgus 03/29/2023    Past Surgical History:  Procedure Laterality Date   TONSILLECTOMY     TONSILLECTOMY     TYMPANOSTOMY TUBE PLACEMENT      OB History   No obstetric history on file.      Home Medications    Prior to Admission medications   Medication Sig Start Date End Date Taking? Authorizing Provider  predniSONE (DELTASONE) 20 MG tablet Take 2 tablets (40 mg total) by mouth daily with breakfast for 5 days. 01/03/24 01/08/24 Yes Leath-Warren, Etta PARAS, NP  tiZANidine (ZANAFLEX) 4 MG tablet Take 1 tablet (4 mg total) by mouth every 8 (eight) hours as needed. 01/03/24  Yes Leath-Warren, Etta PARAS, NP  magic mouthwash (lidocaine , diphenhydrAMINE, alum & mag hydroxide) suspension Swish and spit 5 mLs 3 (three) times daily. 10/18/23   Grooms, Courtney, PA-C   Rimegepant Sulfate (NURTEC) 75 MG TBDP Take 1 tablet (75 mg total) by mouth as needed. 05/05/23   Grooms, Charmaine, PA-C  Testosterone 20.25 MG/ACT (1.62%) GEL APPLY 2 PUMPS ONTO THE SKIN TOPICALLY ONCE DAILY 05/07/23   [provider]    Family History Family History  Problem Relation Age of Onset   Lupus Mother    Diabetes Mother    Depression Father    Anxiety disorder Father    Kidney disease Father    Alzheimer's disease Paternal Grandmother     Social History Social History   Tobacco Use   Smoking status: Never   Smokeless tobacco: Never  Vaping Use   Vaping status: Never Used  Substance Use Topics   Alcohol use: Not Currently   Drug use: Yes    Types: Marijuana     Allergies   Patient has no known allergies.   Review of Systems Review of Systems Per HPI  Physical Exam Triage Vital Signs ED Triage Vitals  Encounter Vitals Group     BP 01/03/24 1440 110/66     Girls Systolic BP Percentile --      Girls Diastolic BP Percentile --      Boys Systolic BP Percentile --      Boys Diastolic BP Percentile --      Pulse Rate 01/03/24 1438 82  Resp 01/03/24 1438 16     Temp 01/03/24 1438 98.2 F (36.8 C)     Temp Source 01/03/24 1438 Oral     SpO2 01/03/24 1438 96 %     Weight --      Height --      Head Circumference --      Peak Flow --      Pain Score 01/03/24 1440 4     Pain Loc --      Pain Education --      Exclude from Growth Chart --    No data found.  Updated Vital Signs BP 110/66   Pulse 82   Temp 98.2 F (36.8 C) (Oral)   Resp 16   LMP 12/05/2023 (Approximate)   SpO2 96%   Visual Acuity Right Eye Distance:   Left Eye Distance:   Bilateral Distance:    Right Eye Near:   Left Eye Near:    Bilateral Near:     Physical Exam Vitals and nursing note reviewed.  Constitutional:      General: She is not in acute distress.    Appearance: Normal appearance.  HENT:     Head: Normocephalic.  Eyes:     Extraocular Movements:  Extraocular movements intact.     Pupils: Pupils are equal, round, and reactive to light.  Pulmonary:     Effort: Pulmonary effort is normal.  Musculoskeletal:     Cervical back: Normal range of motion.     Lumbar back: Spasms and tenderness present. No edema, deformity or signs of trauma. Decreased range of motion. Positive left straight leg raise test.       Back:  Skin:    General: Skin is warm and dry.  Neurological:     General: No focal deficit present.     Mental Status: She is alert and oriented to person, place, and time.  Psychiatric:        Mood and Affect: Mood normal.        Behavior: Behavior normal.      UC Treatments / Results  Labs (all labs ordered are listed, but only abnormal results are displayed) Labs Reviewed - No data to display  EKG   Radiology No results found.  Procedures Procedures (including critical care time)  Medications Ordered in UC Medications  ketorolac  (TORADOL ) 30 MG/ML injection 30 mg (30 mg Intramuscular Given 01/03/24 1455)  dexamethasone (DECADRON) injection 10 mg (10 mg Intramuscular Given 01/03/24 1455)    Initial Impression / Assessment and Plan / UC Course  I have reviewed the triage vital signs and the nursing notes.  Pertinent labs & imaging results that were available during my care of the patient were reviewed by me and considered in my medical decision making (see chart for details).  Symptoms consistent with possible lumbar sprain/strain given the activity prior to the onset of symptoms.  Decadron 10 mg IM and Toradol  30 mg IM administered for pain.  Will have patient start prednisone 40 mg for the next 5 days along with tizanidine 4 mg as a muscle relaxer.  Supportive care recommendations were provided and discussed with the patient to include over-the-counter Tylenol , the use of ice or heat, stretching exercises, and continuing warm Epsom salt baths.  Patient was given strict ER follow-up precautions.  Patient was in  agreement with this plan of care and verbalizes understanding.  All questions were answered.  Patient stable for discharge.  Final Clinical Impressions(s) / UC Diagnoses  Final diagnoses:  Midline low back pain with left-sided sciatica, unspecified chronicity  History of sciatica     Discharge Instructions      You were given injections of Decadron 10 mg and Toradol  30 mg. Start the prednisone tomorrow.  Do not take any additional NSAIDs today to include ibuprofen , Aleve , Motrin , Advil , or naproxen . Recommend taking over-the-counter Tylenol  for breakthrough pain or discomfort. Take medication as prescribed.  Continue to take Tylenol  while you are taking the prednisone. Try to remain as active as possible. Gentle range of motion and stretching exercises to help with back spasm and pain. May apply ice or heat as needed.  Ice is recommended for pain or swelling, heat for spasm or stiffness.  Apply for 20 minutes, remove for 1 hour, then repeat. Go to the emergency department immediately if you develop weakness in your legs or feet, inability to walk, loss of bowel or bladder function, difficulty urinating or passing a bowel movement, or other concerns. If symptoms fail to improve with treatment, recommend follow-up with your primary care physician or orthopedics for further evaluation. Follow-up as needed. Follow-up with your primary care physician if your symptoms do not improve.      ED Prescriptions     Medication Sig Dispense Auth. Provider   predniSONE (DELTASONE) 20 MG tablet Take 2 tablets (40 mg total) by mouth daily with breakfast for 5 days. 10 tablet Leath-Warren, Etta PARAS, NP   tiZANidine (ZANAFLEX) 4 MG tablet Take 1 tablet (4 mg total) by mouth every 8 (eight) hours as needed. 20 tablet Leath-Warren, Etta PARAS, NP      PDMP not reviewed this encounter.   Gilmer Etta PARAS, NP 01/03/24 1517

## 2024-01-03 NOTE — ED Triage Notes (Signed)
 Pt states she was working in her yard yesterday and now having lower back pain and shooting pain down her left leg when moving it.   Tried stretches, Epson salt bath, massage, otc muscle rub with no relief of symptoms.

## 2024-01-04 ENCOUNTER — Ambulatory Visit: Payer: Self-pay | Admitting: Physician Assistant

## 2024-01-09 DIAGNOSIS — Z419 Encounter for procedure for purposes other than remedying health state, unspecified: Secondary | ICD-10-CM | POA: Diagnosis not present

## 2024-01-20 DIAGNOSIS — K029 Dental caries, unspecified: Secondary | ICD-10-CM | POA: Diagnosis not present

## 2024-04-10 ENCOUNTER — Encounter: Payer: Self-pay | Admitting: Nurse Practitioner

## 2024-04-10 ENCOUNTER — Ambulatory Visit
Admission: EM | Admit: 2024-04-10 | Discharge: 2024-04-10 | Disposition: A | Discharge status: Home / Self Care | Attending: Nurse Practitioner | Admitting: Nurse Practitioner

## 2024-04-10 DIAGNOSIS — R6889 Other general symptoms and signs: Secondary | ICD-10-CM

## 2024-04-10 DIAGNOSIS — R059 Cough, unspecified: Secondary | ICD-10-CM

## 2024-04-10 LAB — POCT INFLUENZA A/B
Influenza A, POC: NEGATIVE
Influenza B, POC: NEGATIVE

## 2024-04-10 LAB — POC SOFIA SARS ANTIGEN FIA: SARS Coronavirus 2 Ag: NEGATIVE

## 2024-04-10 MED ORDER — PROMETHAZINE-DM 6.25-15 MG/5ML PO SYRP: 5.0000 mL | ORAL_SOLUTION | Freq: Four times a day (QID) | ORAL | 0 refills | Status: DC | PRN

## 2024-04-10 MED ORDER — OSELTAMIVIR PHOSPHATE 75 MG PO CAPS: 75.0000 mg | ORAL_CAPSULE | Freq: Two times a day (BID) | ORAL | 0 refills | Status: DC

## 2024-04-10 NOTE — ED Triage Notes (Signed)
 Pt reports fever, cough, loss of appetite, ear fullness, body aches, congestion started Friday using tylenol  for the fever.

## 2024-04-10 NOTE — ED Provider Notes (Signed)
 " [facility]    CSN: [id number] Arrival date & time: 04/10/24  1612      History   Chief Complaint No chief complaint on file.   HPI Anita Jacobson is a 34 y.o. female.   The history is provided by the patient.   Patient presents with a 3-day history of fever, chills, fatigue, body aches, ear fullness, and decreased appetite.  Tmax 101.  Denies headache, ear drainage, wheezing, difficulty breathing, abdominal pain, nausea, vomiting, diarrhea, or rash.  Patient endorses chest heaviness with deep breathing.  So far, states that she has been taking over-the-counter cough and cold medications for her symptoms.  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 Past Medical History:  Diagnosis Date   ADHD    Diabetes mellitus without complication [facility])     Patient Active Problem List   Diagnosis Date Noted   Acute streptococcal pharyngitis 10/18/2023   Migraines 07/21/2023   Hallux valgus 03/29/2023    Past Surgical History:  Procedure Laterality Date   TONSILLECTOMY     TONSILLECTOMY     TYMPANOSTOMY TUBE PLACEMENT      OB History   No obstetric history on file.      Home Medications    Prior to Admission medications  Medication Sig Start Date End Date Taking? Authorizing Provider  oseltamivir  (TAMIFLU ) 75 MG capsule Take 1 capsule (75 mg total) by mouth every 12 (twelve) hours. 04/10/24  Yes Leath-Warren, Etta PARAS, NP  promethazine -dextromethorphan (PROMETHAZINE -DM) 6.25-15 MG/5ML syrup Take 5 mLs by mouth 4 (four) times daily as needed. 04/10/24  Yes Leath-Warren, Etta PARAS, NP  Testosterone 20.25 MG/ACT (1.62%) GEL APPLY 2 PUMPS ONTO THE SKIN TOPICALLY ONCE DAILY 05/07/23   [provider]    Family History Family History  Problem Relation Age of Onset   Lupus Mother    Diabetes Mother    Depression Father    Anxiety disorder Father    Kidney disease Father    Alzheimer's disease Paternal Grandmother     Social History Social  History[1]   Allergies   Patient has no known allergies.   Review of Systems Review of Systems Per HPI  Physical Exam Triage Vital Signs ED Triage Vitals  Encounter Vitals Group     BP 04/10/24 1658 (!) 125/56     Girls Systolic BP Percentile --      Girls Diastolic BP Percentile --      Boys Systolic BP Percentile --      Boys Diastolic BP Percentile --      Pulse Rate 04/10/24 1658 70     Resp 04/10/24 1658 20     Temp 04/10/24 1658 97.8 F (36.6 C)     Temp Source 04/10/24 1658 Oral     SpO2 04/10/24 1658 94 %     Weight --      Height --      Head Circumference --      Peak Flow --      Pain Score 04/10/24 1659 4     Pain Loc --      Pain Education --      Exclude from Growth Chart --    No data found.  Updated Vital Signs BP (!) 125/56   Pulse 70   Temp 97.8 F (36.6 C) (Oral)   Resp 20   LMP 04/06/2024 (Exact Date)   SpO2 94%   Visual Acuity Right Eye Distance:   Left Eye Distance:  Bilateral Distance:    Right Eye Near:   Left Eye Near:    Bilateral Near:     Physical Exam Vitals and nursing note reviewed.  Constitutional:      General: She is not in acute distress.    Appearance: Normal appearance.  HENT:     Head: Normocephalic.     Right Ear: Tympanic membrane, ear canal and external ear normal.     Left Ear: Tympanic membrane, ear canal and external ear normal.     Nose: Congestion present.     Mouth/Throat:     Mouth: Mucous membranes are moist.  Eyes:     Extraocular Movements: Extraocular movements intact.     Conjunctiva/sclera: Conjunctivae normal.     Pupils: Pupils are equal, round, and reactive to light.  Cardiovascular:     Rate and Rhythm: Normal rate and regular rhythm.     Pulses: Normal pulses.     Heart sounds: Normal heart sounds.  Pulmonary:     Effort: Pulmonary effort is normal. No respiratory distress.     Breath sounds: Normal breath sounds. No stridor. No wheezing, rhonchi or rales.  Abdominal:      General: Bowel sounds are normal.     Palpations: Abdomen is soft.  Musculoskeletal:     Cervical back: Normal range of motion.  Skin:    General: Skin is warm and dry.  Neurological:     General: No focal deficit present.     Mental Status: She is alert and oriented to person, place, and time.  Psychiatric:        Mood and Affect: Mood normal.        Behavior: Behavior normal.      UC Treatments / Results  Labs (all labs ordered are listed, but only abnormal results are displayed) Labs Reviewed  POCT INFLUENZA A/B - Normal  POC SOFIA SARS ANTIGEN FIA    EKG   Radiology No results found.  Procedures Procedures (including critical care time)  Medications Ordered in UC Medications - No data to display  Initial Impression / Assessment and Plan / UC Course  I have reviewed the triage vital signs and the nursing notes.  Pertinent labs & imaging results that were available during my care of the patient were reviewed by me and considered in my medical decision making (see chart for details).  On exam, the patient's lung sounds are clear throughout, room air sats are at 94%.  COVID and influenza test were negative.  Patient does present with flulike symptoms.  Given her current presentation, I have elected to treat with Tamiflu  75 mg.  Symptomatic treatment provided with Promethazine  DM.  Supportive care recommendations were provided discussed with the patient to include fluids, rest, over-the-counter analgesics, and use of a humidifier during sleep.  Discussed indications with the patient regarding follow-up.  Patient was in agreement with this plan of care and verbalizes understanding.  All questions were answered.  Patient stable for discharge.  Work note was provided.  Final Clinical Impressions(s) / UC Diagnoses   Final diagnoses:  Cough, unspecified type  Flu-like symptoms     Discharge Instructions      The COVID and flu test were negative.  However, given the  patient's current symptoms, we will treat for flulike symptoms with Tamiflu  75 mg. Take medication as prescribed. Increase fluids and allow for plenty of rest. Continue over-the-counter Tylenol  or ibuprofen  as needed for pain, fever, or general discomfort. Recommend the use of  a humidifier in your bedroom at nighttime during sleep and sleeping elevated on pillows while symptoms persist. For your cough, you may find it helpful to use a humidifier in your bedroom at nighttime during sleep and to sleep elevated on pillows while symptoms persist. You should remain home until you have been fever free for 24 hours with no medication. Your symptoms should begin to improve over the next 5 to 7 days.  If symptoms fail to improve, or begin to worsen, you may follow-up in this clinic or with your primary care physician for further evaluation. Follow-up as needed.     ED Prescriptions     Medication Sig Dispense Auth. Provider   oseltamivir  (TAMIFLU ) 75 MG capsule Take 1 capsule (75 mg total) by mouth every 12 (twelve) hours. 10 capsule Leath-Warren, Etta PARAS, NP   promethazine -dextromethorphan (PROMETHAZINE -DM) 6.25-15 MG/5ML syrup Take 5 mLs by mouth 4 (four) times daily as needed. 118 mL Leath-Warren, Etta PARAS, NP      PDMP not reviewed this encounter.     [1]  Social History Tobacco Use   Smoking status: Never   Smokeless tobacco: Never  Vaping Use   Vaping status: Never Used  Substance Use Topics   Alcohol use: Not Currently   Drug use: Yes    Types: Marijuana     Gilmer Etta PARAS, NP 04/10/24 1743  "

## 2024-04-10 NOTE — Discharge Instructions (Addendum)
 The COVID and flu test were negative.  However, given the patient's current symptoms, we will treat for flulike symptoms with Tamiflu  75 mg. Take medication as prescribed. Increase fluids and allow for plenty of rest. Continue over-the-counter Tylenol  or ibuprofen  as needed for pain, fever, or general discomfort. Recommend the use of a humidifier in your bedroom at nighttime during sleep and sleeping elevated on pillows while symptoms persist. For your cough, you may find it helpful to use a humidifier in your bedroom at nighttime during sleep and to sleep elevated on pillows while symptoms persist. You should remain home until you have been fever free for 24 hours with no medication. Your symptoms should begin to improve over the next 5 to 7 days.  If symptoms fail to improve, or begin to worsen, you may follow-up in this clinic or with your primary care physician for further evaluation. Follow-up as needed.

## 2024-04-11 ENCOUNTER — Ambulatory Visit: Payer: Self-pay

## 2024-04-19 ENCOUNTER — Ambulatory Visit: Admitting: Physician Assistant

## 2024-04-19 ENCOUNTER — Encounter: Payer: Self-pay | Admitting: Physician Assistant

## 2024-04-19 VITALS — BP 132/75 | HR 53 | Temp 98.4°F | Ht 67.0 in | Wt 242.0 lb

## 2024-04-19 DIAGNOSIS — R2231 Localized swelling, mass and lump, right upper limb: Secondary | ICD-10-CM | POA: Diagnosis not present

## 2024-04-19 NOTE — Progress Notes (Signed)
" ° °  Acute Office Visit  Subjective:     Patient ID: KEYERRA LAMERE, female    DOB: June 12, 1990, 34 y.o.   MRN: [id number]   Discussed the use of AI scribe software for clinical note transcription with the patient, who gave verbal consent to proceed.  History of Present Illness Anita Jacobson is a 34 year old who presents with a painful nodule on her right pointer finger.  She noticed a bump on her right pointer finger two days ago. The nodule is palpable under the skin but not visible. It is painful when pressed or when she grabs objects, such as a door handle. There is no recent trauma to the hand. No changes in skin color, redness, warmth, or swelling. No associated fevers or systemic symptoms are present. The nodule has remained the same size since she first noticed it and does not affect her hand movements.     Review of Systems  Constitutional:  Negative for chills, fatigue and fever.  Musculoskeletal:  Negative for arthralgias, joint swelling and myalgias.       Objective:     BP 132/75   Pulse (!) 53   Temp 98.4 F (36.9 C)   Ht 5' 7 (1.702 m)   Wt 242 lb (109.8 kg)   LMP 04/06/2024 (Exact Date)   SpO2 92%   BMI 37.90 kg/m   Physical Exam Constitutional:      General: She is not in acute distress.    Appearance: Normal appearance. She is obese. She is not ill-appearing.  HENT:     Head: Normocephalic and atraumatic.  Cardiovascular:     Rate and Rhythm: Normal rate and regular rhythm.     Heart sounds: Normal heart sounds. No murmur heard. Pulmonary:     Effort: Pulmonary effort is normal.     Breath sounds: Normal breath sounds.  Musculoskeletal:     Right hand: Tenderness present. No swelling, deformity or bony tenderness. Normal range of motion. Normal strength.     Comments: Nodule palpated under the skin at the base of 2nd digit on right hand. Mild tenderness noted. No erythema, swelling, or decreased ROM.   Skin:    General: Skin is warm and dry.      Findings: No erythema or rash.  Neurological:     General: No focal deficit present.     Mental Status: She is alert.     No results found for any visits on 04/19/24.      Assessment & Plan:  Nodule of finger of right hand Assessment & Plan: Subcutaneous nodule at the base of the right pointer finger for two days, painful upon palpation or pressure, but no visible changes in skin color or signs of infection. No recent hand trauma. Differential diagnosis includes benign nodule, likely to self resolve. No immediate concern for infection or serious pathology. - Monitor for changes in size, pain, or function. - Return for evaluation if nodule grows, causes significant pain, inhibits hand movement, or if fever develops.     Return if symptoms worsen or fail to improve.  Charlissa Petros, PA-C  "

## 2024-04-19 NOTE — Assessment & Plan Note (Signed)
 Subcutaneous nodule at the base of the right pointer finger for two days, painful upon palpation or pressure, but no visible changes in skin color or signs of infection. No recent hand trauma. Differential diagnosis includes benign nodule, likely to self resolve. No immediate concern for infection or serious pathology. - Monitor for changes in size, pain, or function. - Return for evaluation if nodule grows, causes significant pain, inhibits hand movement, or if fever develops.
